# Patient Record
Sex: Female | Born: 1985 | Race: White | Hispanic: No | Marital: Married | Smoking: Never smoker
Health system: Southern US, Community
[De-identification: ages and names within clinical notes are randomized; demographics above are authoritative.]

## PROBLEM LIST (undated history)

## (undated) ENCOUNTER — Inpatient Hospital Stay (HOSPITAL_COMMUNITY): Payer: Self-pay

## (undated) ENCOUNTER — Inpatient Hospital Stay: Payer: Self-pay

## (undated) DIAGNOSIS — R87629 Unspecified abnormal cytological findings in specimens from vagina: Secondary | ICD-10-CM

## (undated) DIAGNOSIS — R51 Headache: Secondary | ICD-10-CM

## (undated) DIAGNOSIS — N83209 Unspecified ovarian cyst, unspecified side: Secondary | ICD-10-CM

## (undated) HISTORY — DX: Headache: R51

## (undated) HISTORY — DX: Unspecified abnormal cytological findings in specimens from vagina: R87.629

## (undated) HISTORY — PX: WISDOM TOOTH EXTRACTION: SHX21

---

## 2005-03-10 ENCOUNTER — Ambulatory Visit: Payer: Self-pay | Admitting: Pediatrics

## 2005-09-24 ENCOUNTER — Emergency Department: Payer: Self-pay | Admitting: Emergency Medicine

## 2006-08-03 ENCOUNTER — Emergency Department: Payer: Self-pay | Admitting: Emergency Medicine

## 2009-10-04 ENCOUNTER — Emergency Department: Payer: Self-pay | Admitting: Emergency Medicine

## 2013-07-25 LAB — OB RESULTS CONSOLE RPR: RPR: NONREACTIVE

## 2013-07-25 LAB — OB RESULTS CONSOLE RUBELLA ANTIBODY, IGM: Rubella: UNDETERMINED

## 2013-07-25 LAB — OB RESULTS CONSOLE HIV ANTIBODY (ROUTINE TESTING): HIV: NONREACTIVE

## 2013-07-25 LAB — OB RESULTS CONSOLE ABO/RH: RH TYPE: POSITIVE

## 2013-07-25 LAB — OB RESULTS CONSOLE HEPATITIS B SURFACE ANTIGEN: Hepatitis B Surface Ag: NEGATIVE

## 2013-08-01 LAB — OB RESULTS CONSOLE GC/CHLAMYDIA
Chlamydia: NEGATIVE
Gonorrhea: NEGATIVE

## 2013-09-22 ENCOUNTER — Inpatient Hospital Stay (HOSPITAL_COMMUNITY)
Admission: AD | Admit: 2013-09-22 | Discharge: 2013-09-22 | Disposition: A | Discharge status: Home / Self Care | Payer: BC Managed Care – PPO | Source: Ambulatory Visit | Attending: Obstetrics & Gynecology | Admitting: Obstetrics & Gynecology

## 2013-09-22 ENCOUNTER — Inpatient Hospital Stay (HOSPITAL_COMMUNITY): Payer: BC Managed Care – PPO

## 2013-09-22 ENCOUNTER — Ambulatory Visit (HOSPITAL_COMMUNITY): Payer: BC Managed Care – PPO

## 2013-09-22 ENCOUNTER — Encounter (HOSPITAL_COMMUNITY): Payer: Self-pay

## 2013-09-22 DIAGNOSIS — O99891 Other specified diseases and conditions complicating pregnancy: Secondary | ICD-10-CM | POA: Insufficient documentation

## 2013-09-22 DIAGNOSIS — R109 Unspecified abdominal pain: Secondary | ICD-10-CM | POA: Insufficient documentation

## 2013-09-22 DIAGNOSIS — N949 Unspecified condition associated with female genital organs and menstrual cycle: Secondary | ICD-10-CM | POA: Insufficient documentation

## 2013-09-22 DIAGNOSIS — O9989 Other specified diseases and conditions complicating pregnancy, childbirth and the puerperium: Secondary | ICD-10-CM

## 2013-09-22 LAB — WET PREP, GENITAL
Clue Cells Wet Prep HPF POC: NONE SEEN
Trich, Wet Prep: NONE SEEN

## 2013-09-22 LAB — URINALYSIS, ROUTINE W REFLEX MICROSCOPIC
Bilirubin Urine: NEGATIVE
Glucose, UA: NEGATIVE mg/dL
Hgb urine dipstick: NEGATIVE
Nitrite: NEGATIVE
Protein, ur: NEGATIVE mg/dL
Specific Gravity, Urine: 1.01 (ref 1.005–1.030)
pH: 7 (ref 5.0–8.0)

## 2013-09-22 LAB — URINE MICROSCOPIC-ADD ON

## 2013-09-22 NOTE — MAU Provider Note (Signed)
History     CSN: [id number]  Arrival date and time: 09/22/13 1239   First Provider Initiated Contact with Patient 09/22/13 1321      Chief Complaint  Patient presents with  . Abdominal Cramping  . Vaginal Pain   HPI  Julia Chandler is a 27 y.o. G2P0010 at [redacted]w[redacted]d who presents today with cramping. She states the she first noticed the pain when she woke up, but it was not so bad that it woke her up. Initially the pain was constant, but now it seems to come and go. She states that the pain is slightly better now. She rates the pain 7-8/10. She denies any bleeding, LOF or vaginal discharge. She states that she has been feeling the baby move today. She has not had intercourse in the last 24-48 hours.   History reviewed. No pertinent past medical history.  History reviewed. No pertinent past surgical history.  History reviewed. No pertinent family history.  History  Substance Use Topics  . Smoking status: Never Smoker   . Smokeless tobacco: Not on file  . Alcohol Use: No    Allergies: Not on File  No prescriptions prior to admission    ROS Physical Exam   Blood pressure 117/68, pulse 76, temperature 97.8 F (36.6 C), temperature source Oral, resp. rate 18, height 5\' 5"  (1.651 m), weight 67.586 kg (149 lb).  Physical Exam  Nursing note and vitals reviewed. Constitutional: She is oriented to person, place, and time. She appears well-developed and well-nourished. No distress.  Cardiovascular: Normal rate.   Respiratory: Effort normal.  GI: Soft. There is no tenderness.  Genitourinary:   External: no lesion Vagina: small amount of white discharge Cervix: pink, smooth, FT at ext os/closed at internal os  Uterus: AGA    Neurological: She is oriented to person, place, and time.  Skin: Skin is warm and dry.  Psychiatric: She has a normal mood and affect.    MAU Course  Procedures  1350: Spoke with Dr. Langston Masker, will send for ultrasound for cervical length.   Results  for orders placed during the hospital encounter of 09/22/13 (from the past 24 hour(s))  URINALYSIS, ROUTINE W REFLEX MICROSCOPIC     Status: Abnormal   Collection Time    09/22/13  1:04 PM      Result Value Range   Color, Urine YELLOW  YELLOW   APPearance CLEAR  CLEAR   Specific Gravity, Urine 1.010  1.005 - 1.030   pH 7.0  5.0 - 8.0   Glucose, UA NEGATIVE  NEGATIVE mg/dL   Hgb urine dipstick NEGATIVE  NEGATIVE   Bilirubin Urine NEGATIVE  NEGATIVE   Ketones, ur NEGATIVE  NEGATIVE mg/dL   Protein, ur NEGATIVE  NEGATIVE mg/dL   Urobilinogen, UA 0.2  0.0 - 1.0 mg/dL   Nitrite NEGATIVE  NEGATIVE   Leukocytes, UA SMALL (*) NEGATIVE  URINE MICROSCOPIC-ADD ON     Status: Abnormal   Collection Time    09/22/13  1:04 PM      Result Value Range   Squamous Epithelial / LPF RARE  RARE   WBC, UA 0-2  <3 WBC/hpf   Bacteria, UA FEW (*) RARE   Urine-Other MUCOUS PRESENT    WET PREP, GENITAL     Status: Abnormal   Collection Time    09/22/13  1:44 PM      Result Value Range   Yeast Wet Prep HPF POC NONE SEEN  NONE SEEN   Trich, Wet Prep NONE  SEEN  NONE SEEN   Clue Cells Wet Prep HPF POC NONE SEEN  NONE SEEN   WBC, Wet Prep HPF POC FEW (*) NONE SEEN   Cervical length by [place]: 3.4 cm  1654: D/W Dr. Langston Masker, ok for dc home now.   Assessment and Plan   1. Pelvic pain complicating pregnancy, antepartum, second trimester    Follow-up Information   Follow up with MORRIS, MEGAN, DO. (as scheduled )    Specialty:  Obstetrics and Gynecology   Contact information:   [address], [address], [address] [place] [address] [phone]        Medication List         prenatal multivitamin Tabs tablet  Take 1 tablet by mouth daily at 12 noon.       Return to MAU as needed   [name] 09/22/2013, 1:25 PM

## 2013-09-22 NOTE — MAU Note (Signed)
Pt states here for lower pelvic pain that began around 11:15, denies bleeding or vaginal discharge changes. Called MD office and was told to come to MAU for evaluation. Has dull constant pain with intermittent sharp pain that radiates into vagina. Denies uti s/s.

## 2013-10-25 NOTE — L&D Delivery Note (Signed)
Pt pushing x 2 hours and requests VE LOA/+3 R&B discussed and informed consent obtained.Delivery Note  VE applied and 2nd contraction delivered viable female Apgars 8,9 over 2nd degree laceration.  Placenta delivered spontaneously intact with 3VC. Repair with 2-0 Chromic with good support and hemostasis noted and R/V exam confirms.  PH art was sent.  [place] cord blood was not done.  Mother and baby were doing well.  EBL 300cc  Julia Campavid [name], MD

## 2013-12-28 ENCOUNTER — Inpatient Hospital Stay (HOSPITAL_COMMUNITY)
Admission: AD | Admit: 2013-12-28 | Discharge: 2013-12-30 | DRG: 778 | Disposition: A | Discharge status: Home / Self Care | Payer: BC Managed Care – PPO | Source: Ambulatory Visit | Attending: Obstetrics and Gynecology | Admitting: Obstetrics and Gynecology

## 2013-12-28 ENCOUNTER — Encounter (HOSPITAL_COMMUNITY): Payer: Self-pay | Admitting: *Deleted

## 2013-12-28 DIAGNOSIS — O4700 False labor before 37 completed weeks of gestation, unspecified trimester: Principal | ICD-10-CM | POA: Diagnosis present

## 2013-12-28 LAB — CBC
HCT: 36.1 % (ref 36.0–46.0)
Hemoglobin: 12.4 g/dL (ref 12.0–15.0)
MCH: 30.6 pg (ref 26.0–34.0)
MCHC: 34.3 g/dL (ref 30.0–36.0)
MCV: 89.1 fL (ref 78.0–100.0)
Platelets: 299 10*3/uL (ref 150–400)
RBC: 4.05 MIL/uL (ref 3.87–5.11)
RDW: 12.9 % (ref 11.5–15.5)
WBC: 11.7 10*3/uL — ABNORMAL HIGH (ref 4.0–10.5)

## 2013-12-28 LAB — OB RESULTS CONSOLE GBS: STREP GROUP B AG: NEGATIVE

## 2013-12-28 LAB — GROUP B STREP BY PCR: GROUP B STREP BY PCR: NEGATIVE

## 2013-12-28 LAB — OB RESULTS CONSOLE ANTIBODY SCREEN: Antibody Screen: NEGATIVE

## 2013-12-28 MED ORDER — CALCIUM CARBONATE ANTACID 500 MG PO CHEW: 2.0000 | CHEWABLE_TABLET | ORAL | Status: DC | PRN

## 2013-12-28 MED ORDER — LACTATED RINGERS IV SOLN
INTRAVENOUS | Status: DC
  Administered 2013-12-28 – 2013-12-30 (×4): via INTRAVENOUS

## 2013-12-28 MED ORDER — MAGNESIUM SULFATE 40 G IN LACTATED RINGERS - SIMPLE
2.0000 g/h | INTRAVENOUS | Status: DC
  Administered 2013-12-28: 4 g/h via INTRAVENOUS
  Administered 2013-12-29: 2 g/h via INTRAVENOUS
  Filled 2013-12-28 (×3): qty 500

## 2013-12-28 MED ORDER — DOCUSATE SODIUM 100 MG PO CAPS
100.0000 mg | ORAL_CAPSULE | Freq: Every day | ORAL | Status: DC
  Administered 2013-12-28 – 2013-12-30 (×3): 100 mg via ORAL
  Filled 2013-12-28 (×3): qty 1

## 2013-12-28 MED ORDER — ZOLPIDEM TARTRATE 5 MG PO TABS
5.0000 mg | ORAL_TABLET | Freq: Every evening | ORAL | Status: DC | PRN
  Administered 2013-12-28 – 2013-12-29 (×2): 5 mg via ORAL
  Filled 2013-12-28 (×2): qty 1

## 2013-12-28 MED ORDER — ACETAMINOPHEN 325 MG PO TABS
650.0000 mg | ORAL_TABLET | ORAL | Status: DC | PRN
  Administered 2013-12-28 – 2013-12-30 (×5): 650 mg via ORAL
  Filled 2013-12-28 (×5): qty 2

## 2013-12-28 MED ORDER — MAGNESIUM SULFATE BOLUS VIA INFUSION
2.0000 g | Freq: Once | INTRAVENOUS | Status: DC
  Filled 2013-12-28 (×3): qty 500

## 2013-12-28 MED ORDER — BETAMETHASONE SOD PHOS & ACET 6 (3-3) MG/ML IJ SUSP
12.0000 mg | INTRAMUSCULAR | Status: AC
  Administered 2013-12-28 – 2013-12-29 (×2): 12 mg via INTRAMUSCULAR
  Filled 2013-12-28 (×2): qty 2

## 2013-12-28 MED ORDER — PRENATAL MULTIVITAMIN CH
1.0000 | ORAL_TABLET | Freq: Every day | ORAL | Status: DC
  Administered 2013-12-28 – 2013-12-30 (×3): 1 via ORAL
  Filled 2013-12-28 (×3): qty 1

## 2013-12-28 MED ORDER — PRENATAL MULTIVITAMIN CH: 1.0000 | ORAL_TABLET | Freq: Every day | ORAL | Status: DC

## 2013-12-28 NOTE — Progress Notes (Signed)
IV start not charted.  Per label on IV was started @ 1330.

## 2013-12-28 NOTE — H&P (Signed)
Julia Chandler is a 28 y.o. female presenting for evaluation of PTL.  Seen in the office today complaining of ctxs off and on for the last 2 days, not improving with rest.  She stayed home and rested yesterday because of this but found not much improvement.  In the office was found to be 2/50/-2 and sent for further evaulation. History OB History   Grav Para Term Preterm Abortions TAB SAB Ect Mult Living   2    1  1         No past medical history on file. Past Surgical History  Procedure Laterality Date  . Wisdom tooth extraction     Family History: family history includes Cancer in her mother; Hypertension in her father; Miscarriages / [place] in her mother. Social History:  reports that she has never smoked. She has never used smokeless tobacco. She reports that she does not drink alcohol or use illicit drugs.   Prenatal Transfer Tool  Maternal Diabetes: No Genetic Screening: Normal Maternal Ultrasounds/Referrals: Normal Fetal Ultrasounds or other Referrals:  None Maternal Substance Abuse:  No Significant Maternal Medications:  None Significant Maternal Lab Results:  None Other Comments:  None  ROS    Blood pressure 107/60, pulse 94, temperature 97.8 F (36.6 C), resp. rate 18, height 5\' 6"  (1.676 m), weight 74.39 kg (164 lb). Exam Physical Exam   Cx 2/50/-2 Prenatal labs: ABO, Rh: A/Positive/-- (10/01 0000) Antibody:   Rubella: Equivocal (10/01 0000) RPR: Nonreactive (10/01 0000)  HBsAg: Negative (10/01 0000)  HIV: Non-reactive (10/01 0000)  GBS: Negative (03/06 0000)   Assessment/Plan: IUP at 33 3/7 PTL with cx change Plan Magnesium tocolysis and neuroprotection. BMZ series Check GBS   Julia Chandler C 12/28/2013, 5:02 PM

## 2013-12-28 NOTE — Plan of Care (Signed)
Problem: Consults Goal: Birthing Suites Patient Information Press F2 to bring up selections list   Pt < [redacted] weeks EGA     

## 2013-12-28 NOTE — Consult Note (Signed)
Neonatology Consult to Antenatal Patient:  I was asked by Dr. Rana SnareLowe to see this patient in order to provide antenatal counseling due to threatened preterm labor at 33 3/7 weeks.  Ms. Julia Chandler is admitted today at 3233 3/[redacted] weeks GA. She is currently having occasional contractions and is 2 cm dilated. She is getting BMZ and Magnesium Sulfate prophylaxis. This is her first baby.  I spoke with the patient and her mother-in-law. We discussed the worst case of delivery in the next 1-2 days, including usual DR management, possible respiratory complications and need for support, IV access, feedings (mother desires breast feeding, which was encouraged), LOS, Mortality and Morbidity, and long term outcomes. She had only 1-2 questions at this time, which I answered. I offered a NICU tour to any interested family members and would be glad to come back if she has more questions later.  Thank you for asking me to see this patient.  Doretha Souhristie C. Vicie Cech, MD Neonatologist  The total length of face-to-face or floor/unit time for this encounter was 20 minutes. Counseling and/or coordination of care was 15 minutes of the above.

## 2013-12-28 NOTE — Plan of Care (Signed)
Problem: Consults Goal: Birthing Suites Patient Information Press F2 to bring up selections list  Outcome: Completed/Met Date Met:  12/28/13  Pt < [redacted] weeks EGA

## 2013-12-29 NOTE — Progress Notes (Signed)
Julia Chandler is a 28 y.o. G2P0010 at 8760w4d by LMP admitted for Preterm labor  Subjective:   Objective: BP 119/71  Pulse 85  Temp(Src) 97.7 F (36.5 C) (Oral)  Resp 18  Ht 5\' 6"  (1.676 m)  Wt 74.39 kg (164 lb)  BMI 26.48 kg/m2 I/O last 3 completed shifts: In: 4211.7 [P.O.:1990; I.V.:2221.7] Out: 3725 [Urine:3725] Total I/O In: 450 [P.O.:200; I.V.:250] Out: 300 [Urine:300]  FHT:  FHR: 140 bpm, variability: moderate,  accelerations:  Present,  decelerations:  Absent UC:   irrtiability and last hour 2-3 mild ctxs SVE:      Labs: Lab Results  Component Value Date   WBC 11.7* 12/28/2013   HGB 12.4 12/28/2013   HCT 36.1 12/28/2013   MCV 89.1 12/28/2013   PLT 299 12/28/2013    Assessment / Plan: PTL at 33 4/7 Stable on Mag Continue x 24 hours then to procardia Complete BMZ series GBS -  Labor:   Preeclampsia:   Fetal Wellbeing:   Pain Control:   I/D:   Anticipated MO  Julia Chandler C 12/29/2013, 9:51 AM

## 2013-12-30 MED ORDER — NIFEDIPINE 10 MG PO CAPS: 10.0000 mg | ORAL_CAPSULE | Freq: Three times a day (TID) | ORAL | Status: DC

## 2013-12-30 MED ORDER — NIFEDIPINE 10 MG PO CAPS
10.0000 mg | ORAL_CAPSULE | Freq: Three times a day (TID) | ORAL | Status: DC
  Administered 2013-12-30: 10 mg via ORAL
  Filled 2013-12-30: qty 1

## 2013-12-30 NOTE — Discharge Summary (Signed)
Physician Discharge Summary  Patient ID: Julia Chandler MRN: [id number] DOB/AGE: 28-04-1986 28 y.o.  Admit date: 12/28/2013 Discharge date: 12/30/2013  Admission Diagnoses:Preterm labor   Discharge Diagnoses: Same Active Problems:   * No active hospital problems. *   Discharged Condition: good  Hospital Course: pt was admitted at 4633 3/7 weeks with PTL and cervical change.  She responded well to magnesium sulfate tocolysis and received her betamethasone series.  Was weaned to Procardia 10mg  TID and discharged to home with PTL warnings and increased rest  Consults: NICU  Significant Diagnostic Studies:   Treatments: steroids: betamethasone, and magnesium  Discharge Exam: Blood pressure 91/44, pulse 82, temperature 98.2 F (36.8 C), temperature source Oral, resp. rate 18, height 5\' 6"  (1.676 m), weight 74.39 kg (164 lb). General appearance: alert, cooperative, appears stated age and no distress Abd Gravid and nt  Disposition: 01-Home or Self Care  Discharge Orders   Future Orders Complete By Expires   Diet general  As directed    Discharge instructions  As directed    Comments:     Preterm labor warnings Increased Rest   Increase activity slowly  As directed    Sexual Activity Restrictions  As directed    Comments:     Nothing in the vagina until [redacted]weeks EGA       Medication List         acetaminophen 325 MG tablet  Commonly known as:  TYLENOL  Take 650 mg by mouth every 6 (six) hours as needed for moderate pain.     NIFEdipine 10 MG capsule  Commonly known as:  PROCARDIA  Take 1 capsule (10 mg total) by mouth 3 (three) times daily.     prenatal multivitamin Tabs tablet  Take 1 tablet by mouth daily at 12 noon.         Signed: Amous Crewe C 12/30/2013, 10:45 AM

## 2013-12-30 NOTE — Progress Notes (Signed)
Discharge teaching completed and reviewed with patient. Patient provided teachback and asked appropriate questions. Prescription called to [facility] in [place] per patient's request. Pt will follow up with care provider as requested. Discharge per wheelchair with family at side

## 2013-12-30 NOTE — Progress Notes (Signed)
Time change

## 2014-01-02 ENCOUNTER — Encounter (HOSPITAL_COMMUNITY): Payer: Self-pay

## 2014-01-02 ENCOUNTER — Inpatient Hospital Stay (HOSPITAL_COMMUNITY)
Admission: AD | Admit: 2014-01-02 | Discharge: 2014-01-02 | Disposition: A | Discharge status: Home / Self Care | Payer: BC Managed Care – PPO | Source: Ambulatory Visit | Attending: Obstetrics and Gynecology | Admitting: Obstetrics and Gynecology

## 2014-01-02 DIAGNOSIS — O9989 Other specified diseases and conditions complicating pregnancy, childbirth and the puerperium: Principal | ICD-10-CM

## 2014-01-02 DIAGNOSIS — O99891 Other specified diseases and conditions complicating pregnancy: Secondary | ICD-10-CM | POA: Insufficient documentation

## 2014-01-02 DIAGNOSIS — N898 Other specified noninflammatory disorders of vagina: Secondary | ICD-10-CM | POA: Insufficient documentation

## 2014-01-02 DIAGNOSIS — O26899 Other specified pregnancy related conditions, unspecified trimester: Secondary | ICD-10-CM

## 2014-01-02 LAB — URINALYSIS, ROUTINE W REFLEX MICROSCOPIC
BILIRUBIN URINE: NEGATIVE
Glucose, UA: NEGATIVE mg/dL
Hgb urine dipstick: NEGATIVE
KETONES UR: NEGATIVE mg/dL
Leukocytes, UA: NEGATIVE
NITRITE: NEGATIVE
PROTEIN: NEGATIVE mg/dL
Specific Gravity, Urine: 1.01 (ref 1.005–1.030)
UROBILINOGEN UA: 0.2 mg/dL (ref 0.0–1.0)
pH: 7 (ref 5.0–8.0)

## 2014-01-02 NOTE — MAU Note (Addendum)
Pt not sure if she is leaking, since 2315.  Clear.  No bleeding.  No contractions tonight. Baby moving well per pt.  Was recently admitted for contractions and is on Procardia now, was 2/50% on this past Friday.

## 2014-01-02 NOTE — MAU Provider Note (Signed)
History     CSN: [id number]  Arrival date and time: 01/02/14 [id number]   First Provider Initiated Contact with Patient 01/02/14 0049      Chief Complaint  Patient presents with  . Rupture of Membranes   HPI Ms. Alphonzo LemmingsWhitney Joanne GavelSutton is a 28 y.o. G2P0010 at 6816w1d who presents to MAU today with complaint of possible ROM about 1 1/2 hours ago. The patient states that she went to the bathroom and felt that she continued to have fluid coming out after she had thought she was finished urinating. She denies vaginal bleeding, contractions, abdominal pain, UTI symptoms or fever. She reports good fetal movement. She was recently inpatient for preterm labor and started on Procardia. She states last cervical exam was 2cm and 50%.   OB History   Grav Para Term Preterm Abortions TAB SAB Ect Mult Living   2    1  1          Past Medical History  Diagnosis Date  . Medical history non-contributory     Past Surgical History  Procedure Laterality Date  . Wisdom tooth extraction      Family History  Problem Relation Age of Onset  . Cancer Mother   . Miscarriages / [place] Mother   . Hypertension Father     History  Substance Use Topics  . Smoking status: Never Smoker   . Smokeless tobacco: Never Used  . Alcohol Use: No    Allergies: No Known Allergies  Prescriptions prior to admission  Medication Sig Dispense Refill  . acetaminophen (TYLENOL) 325 MG tablet Take 650 mg by mouth every 6 (six) hours as needed for moderate pain.      Marland Kitchen. docusate sodium (COLACE) 100 MG capsule Take 100 mg by mouth daily.      Marland Kitchen. NIFEdipine (PROCARDIA) 10 MG capsule Take 1 capsule (10 mg total) by mouth 3 (three) times daily.  90 capsule  0  . Prenatal Vit-Fe Fumarate-FA (PRENATAL MULTIVITAMIN) TABS tablet Take 1 tablet by mouth daily at 12 noon.        Review of Systems  Gastrointestinal: Negative for abdominal pain.  Genitourinary: Negative for dysuria, urgency and frequency.       + vaginal discharge ?  LOF Neg - vaginal bleeding   Physical Exam   Blood pressure 97/72, pulse 95, temperature 98.3 F (36.8 C), temperature source Oral, resp. rate 18, height 5\' 5"  (1.651 m), weight 167 lb 3.2 oz (75.841 kg).  Physical Exam  Constitutional: She is oriented to person, place, and time. She appears well-developed and well-nourished. No distress.  HENT:  Head: Normocephalic and atraumatic.  Cardiovascular: Normal rate.   Respiratory: Effort normal.  GI: Soft.  Genitourinary: No bleeding around the vagina. Vaginal discharge (small amount of mucus discharge noted) found.  No pooling noted  Neurological: She is alert and oriented to person, place, and time.  Skin: Skin is warm and dry. No erythema.  Psychiatric: She has a normal mood and affect.   Fern - negative   Fetal monitoring: Baseline: 140 bpm, moderate variability, + accelerations, no decelerations Contractions: none  MAU Course  Procedures None  MDM Discussed with Dr. Arelia SneddonMcComb. Ok for discharge  Assessment and Plan  A: Membranes intact Vaginal discharge in pregnancy  P: Discharge home Labor precautions and kick counts discussed Patient advised to continue Procardia and follow-up in the office as scheduled for routine prenatal care Patient may return to MAU as needed or if her condition were to change or  worsen  Freddi Starr, PA-C  01/02/2014, 12:49 AM

## 2014-01-02 NOTE — Discharge Instructions (Signed)
Preterm Labor Information Preterm labor is when labor starts at less than 37 weeks of pregnancy. The normal length of a pregnancy is 39 to 41 weeks. CAUSES Often, there is no identifiable underlying cause as to why a woman goes into preterm labor. One of the most common known causes of preterm labor is infection. Infections of the uterus, cervix, vagina, amniotic sac, bladder, kidney, or even the lungs (pneumonia) can cause labor to start. Other suspected causes of preterm labor include:   Urogenital infections, such as yeast infections and bacterial vaginosis.   Uterine abnormalities (uterine shape, uterine septum, fibroids, or bleeding from the placenta).   A cervix that has been operated on (it may fail to stay closed).   Malformations in the fetus.   Multiple gestations (twins, triplets, and so on).   Breakage of the amniotic sac.  RISK FACTORS  Having a previous history of preterm labor.   Having premature rupture of membranes (PROM).   Having a placenta that covers the opening of the cervix (placenta previa).   Having a placenta that separates from the uterus (placental abruption).   Having a cervix that is too weak to hold the fetus in the uterus (incompetent cervix).   Having too much fluid in the amniotic sac (polyhydramnios).   Taking illegal drugs or smoking while pregnant.   Not gaining enough weight while pregnant.   Being younger than 18 and older than 28 years old.   Having a low socioeconomic status.   Being African American. SYMPTOMS Signs and symptoms of preterm labor include:   Menstrual-like cramps, abdominal pain, or back pain.  Uterine contractions that are regular, as frequent as six in an hour, regardless of their intensity (may be mild or painful).  Contractions that start on the top of the uterus and spread down to the lower abdomen and back.   A sense of increased pelvic pressure.   A watery or bloody mucus discharge that  comes from the vagina.  TREATMENT Depending on the length of the pregnancy and other circumstances, your health care provider may suggest bed rest. If necessary, there are medicines that can be given to stop contractions and to mature the fetal lungs. If labor happens before 34 weeks of pregnancy, a prolonged hospital stay may be recommended. Treatment depends on the condition of both you and the fetus.  WHAT SHOULD YOU DO IF YOU THINK YOU ARE IN PRETERM LABOR? Call your health care provider right away. You will need to go to the hospital to get checked immediately. HOW CAN YOU PREVENT PRETERM LABOR IN FUTURE PREGNANCIES? You should:   Stop smoking if you smoke.  Maintain healthy weight gain and avoid chemicals and drugs that are not necessary.  Be watchful for any type of infection.  Inform your health care provider if you have a known history of preterm labor. Document Released: 01/01/2004 Document Revised: 06/13/2013 Document Reviewed: 11/13/2012 [employer] Patient Information 2014 [employer].    

## 2014-02-01 ENCOUNTER — Encounter (HOSPITAL_COMMUNITY): Payer: Self-pay | Admitting: *Deleted

## 2014-02-01 ENCOUNTER — Telehealth (HOSPITAL_COMMUNITY): Payer: Self-pay | Admitting: *Deleted

## 2014-02-01 NOTE — Telephone Encounter (Signed)
Preadmission screen  

## 2014-02-04 ENCOUNTER — Encounter (HOSPITAL_COMMUNITY): Payer: Self-pay | Admitting: *Deleted

## 2014-02-04 ENCOUNTER — Inpatient Hospital Stay (HOSPITAL_COMMUNITY)
Admission: AD | Admit: 2014-02-04 | Discharge: 2014-02-04 | Disposition: A | Discharge status: Home / Self Care | Payer: BC Managed Care – PPO | Source: Ambulatory Visit | Attending: Obstetrics and Gynecology | Admitting: Obstetrics and Gynecology

## 2014-02-04 DIAGNOSIS — O479 False labor, unspecified: Secondary | ICD-10-CM | POA: Insufficient documentation

## 2014-02-04 DIAGNOSIS — R109 Unspecified abdominal pain: Secondary | ICD-10-CM | POA: Insufficient documentation

## 2014-02-04 HISTORY — DX: Unspecified ovarian cyst, unspecified side: N83.209

## 2014-02-04 LAB — AMNISURE RUPTURE OF MEMBRANE (ROM) NOT AT ARMC: Amnisure ROM: NEGATIVE

## 2014-02-04 LAB — POCT FERN TEST: POCT Fern Test: NEGATIVE

## 2014-02-04 NOTE — MAU Note (Signed)
Panties have been wet all day, has been having back contractions, increased cramping.

## 2014-02-04 NOTE — Discharge Instructions (Signed)
Third Trimester of Pregnancy  The third trimester is from week 29 through week 42, months 7 through 9. The third trimester is a time when the fetus is growing rapidly. At the end of the ninth month, the fetus is about 20 inches in length and weighs 6 10 pounds.   BODY CHANGES  Your body goes through many changes during pregnancy. The changes vary from woman to woman.    Your weight will continue to increase. You can expect to gain 25 35 pounds (11 16 kg) by the end of the pregnancy.   You may begin to get stretch marks on your hips, abdomen, and breasts.   You may urinate more often because the fetus is moving lower into your pelvis and pressing on your bladder.   You may develop or continue to have heartburn as a result of your pregnancy.   You may develop constipation because certain hormones are causing the muscles that push waste through your intestines to slow down.   You may develop hemorrhoids or swollen, bulging veins (varicose veins).   You may have pelvic pain because of the weight gain and pregnancy hormones relaxing your joints between the bones in your pelvis. Back aches may result from over exertion of the muscles supporting your posture.   Your breasts will continue to grow and be tender. A yellow discharge may leak from your breasts called colostrum.   Your belly button may stick out.   You may feel short of breath because of your expanding uterus.   You may notice the fetus "dropping," or moving lower in your abdomen.   You may have a bloody mucus discharge. This usually occurs a few days to a week before labor begins.   Your cervix becomes thin and soft (effaced) near your due date.  WHAT TO EXPECT AT YOUR PRENATAL EXAMS   You will have prenatal exams every 2 weeks until week 36. Then, you will have weekly prenatal exams. During a routine prenatal visit:   You will be weighed to make sure you and the fetus are growing normally.   Your blood pressure is taken.   Your abdomen will be  measured to track your baby's growth.   The fetal heartbeat will be listened to.   Any test results from the previous visit will be discussed.   You may have a cervical check near your due date to see if you have effaced.  At around 36 weeks, your caregiver will check your cervix. At the same time, your caregiver will also perform a test on the secretions of the vaginal tissue. This test is to determine if a type of bacteria, Group B streptococcus, is present. Your caregiver will explain this further.  Your caregiver may ask you:   What your birth plan is.   How you are feeling.   If you are feeling the baby move.   If you have had any abnormal symptoms, such as leaking fluid, bleeding, severe headaches, or abdominal cramping.   If you have any questions.  Other tests or screenings that may be performed during your third trimester include:   Blood tests that check for low iron levels (anemia).   Fetal testing to check the health, activity level, and growth of the fetus. Testing is done if you have certain medical conditions or if there are problems during the pregnancy.  FALSE LABOR  You may feel small, irregular contractions that eventually go away. These are called Braxton Hicks contractions, or   false labor. Contractions may last for hours, days, or even weeks before true labor sets in. If contractions come at regular intervals, intensify, or become painful, it is best to be seen by your caregiver.   SIGNS OF LABOR    Menstrual-like cramps.   Contractions that are 5 minutes apart or less.   Contractions that start on the top of the uterus and spread down to the lower abdomen and back.   A sense of increased pelvic pressure or back pain.   A watery or bloody mucus discharge that comes from the vagina.  If you have any of these signs before the 37th week of pregnancy, call your caregiver right away. You need to go to the hospital to get checked immediately.  HOME CARE INSTRUCTIONS    Avoid all  smoking, herbs, alcohol, and unprescribed drugs. These chemicals affect the formation and growth of the baby.   Follow your caregiver's instructions regarding medicine use. There are medicines that are either safe or unsafe to take during pregnancy.   Exercise only as directed by your caregiver. Experiencing uterine cramps is a good sign to stop exercising.   Continue to eat regular, healthy meals.   Wear a good support bra for breast tenderness.   Do not use hot tubs, steam rooms, or saunas.   Wear your seat belt at all times when driving.   Avoid raw meat, uncooked cheese, cat litter boxes, and soil used by cats. These carry germs that can cause birth defects in the baby.   Take your prenatal vitamins.   Try taking a stool softener (if your caregiver approves) if you develop constipation. Eat more high-fiber foods, such as fresh vegetables or fruit and whole grains. Drink plenty of fluids to keep your urine clear or pale yellow.   Take warm sitz baths to soothe any pain or discomfort caused by hemorrhoids. Use hemorrhoid cream if your caregiver approves.   If you develop varicose veins, wear support hose. Elevate your feet for 15 minutes, 3 4 times a day. Limit salt in your diet.   Avoid heavy lifting, wear low heal shoes, and practice good posture.   Rest a lot with your legs elevated if you have leg cramps or low back pain.   Visit your dentist if you have not gone during your pregnancy. Use a soft toothbrush to brush your teeth and be gentle when you floss.   A sexual relationship may be continued unless your caregiver directs you otherwise.   Do not travel far distances unless it is absolutely necessary and only with the approval of your caregiver.   Take prenatal classes to understand, practice, and ask questions about the labor and delivery.   Make a trial run to the hospital.   Pack your hospital bag.   Prepare the baby's nursery.   Continue to go to all your prenatal visits as directed  by your caregiver.  SEEK MEDICAL CARE IF:   You are unsure if you are in labor or if your water has broken.   You have dizziness.   You have mild pelvic cramps, pelvic pressure, or nagging pain in your abdominal area.   You have persistent nausea, vomiting, or diarrhea.   You have a bad smelling vaginal discharge.   You have pain with urination.  SEEK IMMEDIATE MEDICAL CARE IF:    You have a fever.   You are leaking fluid from your vagina.   You have spotting or bleeding from your vagina.     You have severe abdominal cramping or pain.   You have rapid weight loss or gain.   You have shortness of breath with chest pain.   You notice sudden or extreme swelling of your face, hands, ankles, feet, or legs.   You have not felt your baby move in over an hour.   You have severe headaches that do not go away with medicine.   You have vision changes.  Document Released: 10/05/2001 Document Revised: 06/13/2013 Document Reviewed: 12/12/2012  [employer] Patient Information 2014 [employer].

## 2014-02-04 NOTE — MAU Note (Signed)
Pt agrees, no ctx's since arrival.  Clear/white water noted at introitus prior to exam.  Neg fern.  cx unchanged

## 2014-02-07 ENCOUNTER — Inpatient Hospital Stay (HOSPITAL_COMMUNITY)
Admission: AD | Admit: 2014-02-07 | Discharge: 2014-02-10 | DRG: 775 | Disposition: A | Discharge status: Home / Self Care | Payer: BC Managed Care – PPO | Source: Ambulatory Visit | Attending: Obstetrics and Gynecology | Admitting: Obstetrics and Gynecology

## 2014-02-07 DIAGNOSIS — IMO0001 Reserved for inherently not codable concepts without codable children: Secondary | ICD-10-CM

## 2014-02-07 DIAGNOSIS — O411090 Infection of amniotic sac and membranes, unspecified, unspecified trimester, not applicable or unspecified: Secondary | ICD-10-CM | POA: Diagnosis present

## 2014-02-08 ENCOUNTER — Encounter (HOSPITAL_COMMUNITY): Payer: BC Managed Care – PPO | Admitting: Anesthesiology

## 2014-02-08 ENCOUNTER — Encounter (HOSPITAL_COMMUNITY): Payer: Self-pay | Admitting: Anesthesiology

## 2014-02-08 ENCOUNTER — Inpatient Hospital Stay (HOSPITAL_COMMUNITY)
Admission: RE | Admit: 2014-02-08 | Discharge: 2014-02-08 | Disposition: A | Discharge status: Home / Self Care | Payer: BC Managed Care – PPO | Source: Ambulatory Visit | Attending: Obstetrics and Gynecology | Admitting: Obstetrics and Gynecology

## 2014-02-08 ENCOUNTER — Inpatient Hospital Stay (HOSPITAL_COMMUNITY): Payer: BC Managed Care – PPO | Admitting: Anesthesiology

## 2014-02-08 DIAGNOSIS — IMO0001 Reserved for inherently not codable concepts without codable children: Secondary | ICD-10-CM

## 2014-02-08 LAB — RPR

## 2014-02-08 LAB — CBC
HCT: 37 % (ref 36.0–46.0)
HCT: 35.6 % — ABNORMAL LOW (ref 36.0–46.0)
Hemoglobin: 12.9 g/dL (ref 12.0–15.0)
Hemoglobin: 12.3 g/dL (ref 12.0–15.0)
MCH: 31.3 pg (ref 26.0–34.0)
MCH: 31.2 pg (ref 26.0–34.0)
MCHC: 34.9 g/dL (ref 30.0–36.0)
MCHC: 34.6 g/dL (ref 30.0–36.0)
MCV: 89.8 fL (ref 78.0–100.0)
MCV: 90.4 fL (ref 78.0–100.0)
PLATELETS: 268 10*3/uL (ref 150–400)
Platelets: 286 10*3/uL (ref 150–400)
RBC: 4.12 MIL/uL (ref 3.87–5.11)
RBC: 3.94 MIL/uL (ref 3.87–5.11)
RDW: 13.9 % (ref 11.5–15.5)
RDW: 14 % (ref 11.5–15.5)
WBC: 12.1 10*3/uL — ABNORMAL HIGH (ref 4.0–10.5)
WBC: 14.8 10*3/uL — AB (ref 4.0–10.5)

## 2014-02-08 LAB — COMPREHENSIVE METABOLIC PANEL
ALBUMIN: 2.4 g/dL — AB (ref 3.5–5.2)
ALT: 8 U/L (ref 0–35)
AST: 14 U/L (ref 0–37)
Alkaline Phosphatase: 170 U/L — ABNORMAL HIGH (ref 39–117)
BUN: 6 mg/dL (ref 6–23)
CALCIUM: 8.7 mg/dL (ref 8.4–10.5)
CO2: 18 meq/L — AB (ref 19–32)
Chloride: 101 mEq/L (ref 96–112)
Creatinine, Ser: 0.61 mg/dL (ref 0.50–1.10)
GFR calc Af Amer: >90 mL/min (ref >90)
GFR calc non Af Amer: >90 mL/min (ref >90)
Glucose, Bld: 81 mg/dL (ref 70–99)
Potassium: 4.1 mEq/L (ref 3.7–5.3)
Sodium: 136 mEq/L — ABNORMAL LOW (ref 137–147)
Total Protein: 5.9 g/dL — ABNORMAL LOW (ref 6.0–8.3)

## 2014-02-08 LAB — URIC ACID: Uric Acid, Serum: 4.7 mg/dL (ref 2.4–7.0)

## 2014-02-08 MED ORDER — ONDANSETRON HCL 4 MG/2ML IJ SOLN: 4.0000 mg | INTRAMUSCULAR | Status: DC | PRN

## 2014-02-08 MED ORDER — TETANUS-DIPHTH-ACELL PERTUSSIS 5-2.5-18.5 LF-MCG/0.5 IM SUSP
0.5000 mL | Freq: Once | INTRAMUSCULAR | Status: DC
Start: 2014-02-09 — End: 2014-02-10

## 2014-02-08 MED ORDER — DIBUCAINE 1 % RE OINT
1.0000 "application " | TOPICAL_OINTMENT | RECTAL | Status: DC | PRN
  Administered 2014-02-08: 1 via RECTAL
  Filled 2014-02-08: qty 28

## 2014-02-08 MED ORDER — SENNOSIDES-DOCUSATE SODIUM 8.6-50 MG PO TABS
2.0000 | ORAL_TABLET | ORAL | Status: DC
  Administered 2014-02-08: 1 via ORAL
  Administered 2014-02-09: 2 via ORAL
  Filled 2014-02-08: qty 1
  Filled 2014-02-08: qty 2

## 2014-02-08 MED ORDER — EPHEDRINE 5 MG/ML INJ
10.0000 mg | INTRAVENOUS | Status: DC | PRN
  Filled 2014-02-08: qty 4

## 2014-02-08 MED ORDER — WITCH HAZEL-GLYCERIN EX PADS
1.0000 "application " | MEDICATED_PAD | CUTANEOUS | Status: DC | PRN
  Administered 2014-02-09: 1 via TOPICAL

## 2014-02-08 MED ORDER — SIMETHICONE 80 MG PO CHEW: 80.0000 mg | CHEWABLE_TABLET | ORAL | Status: DC | PRN

## 2014-02-08 MED ORDER — PHENYLEPHRINE 40 MCG/ML (10ML) SYRINGE FOR IV PUSH (FOR BLOOD PRESSURE SUPPORT)
80.0000 ug | PREFILLED_SYRINGE | INTRAVENOUS | Status: DC | PRN
  Filled 2014-02-08: qty 10

## 2014-02-08 MED ORDER — LACTATED RINGERS IV SOLN: 500.0000 mL | INTRAVENOUS | Status: DC | PRN

## 2014-02-08 MED ORDER — LIDOCAINE HCL (PF) 1 % IJ SOLN
30.0000 mL | INTRAMUSCULAR | Status: AC | PRN
  Administered 2014-02-08: 30 mL via SUBCUTANEOUS
  Filled 2014-02-08: qty 30

## 2014-02-08 MED ORDER — ONDANSETRON HCL 4 MG/2ML IJ SOLN
4.0000 mg | Freq: Four times a day (QID) | INTRAMUSCULAR | Status: DC | PRN
Start: 2014-02-08 — End: 2014-02-08

## 2014-02-08 MED ORDER — OXYTOCIN BOLUS FROM INFUSION: 500.0000 mL | INTRAVENOUS | Status: DC

## 2014-02-08 MED ORDER — PRENATAL MULTIVITAMIN CH
1.0000 | ORAL_TABLET | Freq: Every day | ORAL | Status: DC
  Administered 2014-02-09 – 2014-02-10 (×2): 1 via ORAL
  Filled 2014-02-08 (×2): qty 1

## 2014-02-08 MED ORDER — OXYCODONE-ACETAMINOPHEN 5-325 MG PO TABS: 1.0000 | ORAL_TABLET | ORAL | Status: DC | PRN

## 2014-02-08 MED ORDER — LACTATED RINGERS IV SOLN: 500.0000 mL | Freq: Once | INTRAVENOUS | Status: DC

## 2014-02-08 MED ORDER — DIPHENHYDRAMINE HCL 25 MG PO CAPS: 25.0000 mg | ORAL_CAPSULE | Freq: Four times a day (QID) | ORAL | Status: DC | PRN

## 2014-02-08 MED ORDER — DIPHENHYDRAMINE HCL 50 MG/ML IJ SOLN: 12.5000 mg | INTRAMUSCULAR | Status: DC | PRN

## 2014-02-08 MED ORDER — OXYTOCIN 40 UNITS IN LACTATED RINGERS INFUSION - SIMPLE MED
62.5000 mL/h | INTRAVENOUS | Status: DC
  Administered 2014-02-08: 999 mL/h via INTRAVENOUS
  Filled 2014-02-08: qty 1000

## 2014-02-08 MED ORDER — BUTORPHANOL TARTRATE 1 MG/ML IJ SOLN: 1.0000 mg | INTRAMUSCULAR | Status: DC | PRN

## 2014-02-08 MED ORDER — ACETAMINOPHEN 325 MG PO TABS: 650.0000 mg | ORAL_TABLET | ORAL | Status: DC | PRN

## 2014-02-08 MED ORDER — BENZOCAINE-MENTHOL 20-0.5 % EX AERO
1.0000 | INHALATION_SPRAY | CUTANEOUS | Status: DC | PRN
Start: 2014-02-08 — End: 2014-02-10
  Administered 2014-02-08: 1 via TOPICAL
  Filled 2014-02-08: qty 56

## 2014-02-08 MED ORDER — FENTANYL 2.5 MCG/ML BUPIVACAINE 1/10 % EPIDURAL INFUSION (WH - ANES)
14.0000 mL/h | INTRAMUSCULAR | Status: DC | PRN
  Administered 2014-02-08: 14 mL/h via EPIDURAL
  Filled 2014-02-08 (×2): qty 125

## 2014-02-08 MED ORDER — MEASLES, MUMPS & RUBELLA VAC ~~LOC~~ INJ: 0.5000 mL | INJECTION | Freq: Once | SUBCUTANEOUS | Status: DC

## 2014-02-08 MED ORDER — ONDANSETRON HCL 4 MG PO TABS: 4.0000 mg | ORAL_TABLET | ORAL | Status: DC | PRN

## 2014-02-08 MED ORDER — MEDROXYPROGESTERONE ACETATE 150 MG/ML IM SUSP: 150.0000 mg | INTRAMUSCULAR | Status: DC | PRN

## 2014-02-08 MED ORDER — ZOLPIDEM TARTRATE 5 MG PO TABS
5.0000 mg | ORAL_TABLET | Freq: Every evening | ORAL | Status: DC | PRN
  Administered 2014-02-08: 5 mg via ORAL
  Filled 2014-02-08: qty 1

## 2014-02-08 MED ORDER — LACTATED RINGERS IV SOLN
INTRAVENOUS | Status: DC
  Administered 2014-02-08 (×2): via INTRAVENOUS

## 2014-02-08 MED ORDER — TRAMADOL HCL 50 MG PO TABS
50.0000 mg | ORAL_TABLET | Freq: Four times a day (QID) | ORAL | Status: DC | PRN
  Administered 2014-02-08 – 2014-02-10 (×6): 50 mg via ORAL
  Filled 2014-02-08 (×6): qty 1

## 2014-02-08 MED ORDER — LIDOCAINE HCL (PF) 1 % IJ SOLN
INTRAMUSCULAR | Status: DC | PRN
  Administered 2014-02-08 (×2): 4 mL

## 2014-02-08 MED ORDER — IBUPROFEN 600 MG PO TABS
600.0000 mg | ORAL_TABLET | Freq: Four times a day (QID) | ORAL | Status: DC
  Administered 2014-02-08 – 2014-02-10 (×8): 600 mg via ORAL
  Filled 2014-02-08 (×8): qty 1

## 2014-02-08 MED ORDER — EPHEDRINE 5 MG/ML INJ: 10.0000 mg | INTRAVENOUS | Status: DC | PRN

## 2014-02-08 MED ORDER — PHENYLEPHRINE 40 MCG/ML (10ML) SYRINGE FOR IV PUSH (FOR BLOOD PRESSURE SUPPORT): 80.0000 ug | PREFILLED_SYRINGE | INTRAVENOUS | Status: DC | PRN

## 2014-02-08 MED ORDER — LANOLIN HYDROUS EX OINT
TOPICAL_OINTMENT | CUTANEOUS | Status: DC | PRN
Start: 2014-02-08 — End: 2014-02-10

## 2014-02-08 MED ORDER — ZOLPIDEM TARTRATE 5 MG PO TABS: 5.0000 mg | ORAL_TABLET | Freq: Every evening | ORAL | Status: DC | PRN

## 2014-02-08 MED ORDER — CITRIC ACID-SODIUM CITRATE 334-500 MG/5ML PO SOLN
30.0000 mL | ORAL | Status: DC | PRN
Start: 2014-02-08 — End: 2014-02-08

## 2014-02-08 MED ORDER — FENTANYL 2.5 MCG/ML BUPIVACAINE 1/10 % EPIDURAL INFUSION (WH - ANES)
INTRAMUSCULAR | Status: DC | PRN
  Administered 2014-02-08: 14 mL/h via EPIDURAL

## 2014-02-08 MED ORDER — IBUPROFEN 600 MG PO TABS: 600.0000 mg | ORAL_TABLET | Freq: Four times a day (QID) | ORAL | Status: DC | PRN

## 2014-02-08 NOTE — H&P (Signed)
Julia Chandler is a 28 y.o. female presenting for SOL. Denies HA, Blurry vision, epigastric pain. Pregnancy complicated by PTL with admission in March. Maternal Medical History:  Reason for admission: Contractions.   Fetal activity: Perceived fetal activity is normal.    Prenatal complications: Preterm labor.     OB History   Grav Para Term Preterm Abortions TAB SAB Ect Mult Living   2    1  1         Past Medical History  Diagnosis Date  . Headache(784.0)   . Preterm labor   . Ovarian cyst   . Vaginal Pap smear, abnormal     f/u ok   Past Surgical History  Procedure Laterality Date  . Wisdom tooth extraction     Family History: family history includes Anxiety disorder in her sister; Cancer in her maternal grandfather and mother; Heart disease in her paternal grandfather and paternal grandmother; Hypertension in her father, paternal grandfather, and paternal grandmother; Miscarriages / [place] in her mother; Thyroid disease in her maternal grandmother. Social History:  reports that she has never smoked. She has never used smokeless tobacco. She reports that she does not drink alcohol or use illicit drugs.   Prenatal Transfer Tool  Maternal Diabetes: No Genetic Screening: Normal Maternal Ultrasounds/Referrals: Normal Fetal Ultrasounds or other Referrals:  None Maternal Substance Abuse:  No Significant Maternal Medications:  None Significant Maternal Lab Results:  None Other Comments:  None  Review of Systems  Eyes: Negative for blurred vision.  Gastrointestinal: Negative for abdominal pain.  Neurological: Negative for headaches.    Dilation: 8.5 Effacement (%): 100 Station: 0 Exam by:: Lucas MallowKlashley, RN Blood pressure 128/85, pulse 86, temperature 98.1 F (36.7 C), temperature source Oral, resp. rate 18, height 5\' 6"  (1.676 m), weight 172 lb (78.019 kg), SpO2 99.00%. Maternal Exam:  Uterine Assessment: Contraction strength is firm.  Contraction frequency is regular.    Abdomen: Fetal presentation: vertex     Fetal Exam Fetal State Assessment: Category I - tracings are normal.     Physical Exam  Cardiovascular: Normal rate and regular rhythm.   Respiratory: Effort normal and breath sounds normal.  GI: Soft. There is no tenderness.  Neurological: She has normal reflexes.    Prenatal labs: ABO, Rh: A/Positive/-- (10/01 0000) Antibody: Negative (03/06 1821) Rubella: Equivocal (10/01 0000) RPR: Nonreactive (10/01 0000)  HBsAg: Negative (10/01 0000)  HIV: Non-reactive (10/01 0000)  GBS: Negative (03/06 0000)   Assessment/Plan: 28 yo G2P0 at 39 3/7 weeks in active labor Labile BPs-will check PIH labs   Julia LocusJames E Nolan Lasser Chandler 02/08/2014, 5:30 AM

## 2014-02-08 NOTE — Anesthesia Preprocedure Evaluation (Signed)
Anesthesia Evaluation  Patient identified by MRN, date of birth, ID band Patient awake    Reviewed: Allergy & Precautions, H&P , Patient's Chart, lab work & pertinent test results  Airway Mallampati: III TM Distance: >3 FB Neck ROM: Full    Dental no notable dental hx. (+) Teeth Intact   Pulmonary neg pulmonary ROS,  breath sounds clear to auscultation  Pulmonary exam normal       Cardiovascular negative cardio ROS  Rhythm:Regular Rate:Normal     Neuro/Psych  Headaches, negative psych ROS   GI/Hepatic negative GI ROS, Neg liver ROS,   Endo/Other  negative endocrine ROS  Renal/GU negative Renal ROS  negative genitourinary   Musculoskeletal negative musculoskeletal ROS (+)   Abdominal (+) - obese,   Peds  Hematology negative hematology ROS (+)   Anesthesia Other Findings   Reproductive/Obstetrics (+) Pregnancy Hx/o Preterm labor                           Anesthesia Physical Anesthesia Plan  ASA: II  Anesthesia Plan: Epidural   Post-op Pain Management:    Induction:   Airway Management Planned: Natural Airway  Additional Equipment:   Intra-op Plan:   Post-operative Plan:   Informed Consent: I have reviewed the patients History and Physical, chart, labs and discussed the procedure including the risks, benefits and alternatives for the proposed anesthesia with the patient or authorized representative who has indicated his/her understanding and acceptance.     Plan Discussed with: Anesthesiologist  Anesthesia Plan Comments:         Anesthesia Quick Evaluation

## 2014-02-08 NOTE — Anesthesia Procedure Notes (Signed)
Epidural Patient location during procedure: OB Start time: 02/08/2014 2:29 AM  Staffing Anesthesiologist: Caroline Matters A. Performed by: anesthesiologist   Preanesthetic Checklist Completed: patient identified, site marked, surgical consent, pre-op evaluation, timeout performed, IV checked, risks and benefits discussed and monitors and equipment checked  Epidural Patient position: sitting Prep: site prepped and draped and DuraPrep Patient monitoring: continuous pulse ox and blood pressure Approach: midline Location: L3-L4 Injection technique: LOR air  Needle:  Needle type: Tuohy  Needle gauge: 17 G Needle length: 9 cm and 9 Needle insertion depth: 5 cm cm Catheter type: closed end flexible Catheter size: 19 Gauge Catheter at skin depth: 10 cm Test dose: negative and Other  Assessment Events: blood not aspirated, injection not painful, no injection resistance, negative IV test and no paresthesia  Additional Notes Patient identified. Risks and benefits discussed including failed block, incomplete  Pain control, post dural puncture headache, nerve damage, paralysis, blood pressure Changes, nausea, vomiting, reactions to medications-both toxic and allergic and post Partum back pain. All questions were answered. Patient expressed understanding and wished to proceed. Sterile technique was used throughout procedure. Epidural site was Dressed with sterile barrier dressing. No paresthesias, signs of intravascular injection Or signs of intrathecal spread were encountered.  Patient was more comfortable after the epidural was dosed. Please see RN's note for documentation of vital signs and FHR which are stable.

## 2014-02-09 LAB — CBC
HCT: 30.7 % — ABNORMAL LOW (ref 36.0–46.0)
HEMOGLOBIN: 10.3 g/dL — AB (ref 12.0–15.0)
MCH: 30.3 pg (ref 26.0–34.0)
MCHC: 33.2 g/dL (ref 30.0–36.0)
MCV: 91.1 fL (ref 78.0–100.0)
Platelets: 240 10*3/uL (ref 150–400)
RBC: 3.37 MIL/uL — ABNORMAL LOW (ref 3.87–5.11)
RDW: 14.3 % (ref 11.5–15.5)
WBC: 15 10*3/uL — ABNORMAL HIGH (ref 4.0–10.5)

## 2014-02-09 NOTE — Anesthesia Postprocedure Evaluation (Signed)
Anesthesia Post Note  Patient: Julia Chandler  Procedure(s) Performed: * No procedures listed *  Anesthesia type: Epidural  Patient location: Mother/Baby  Post pain: Pain level controlled  Post assessment: Post-op Vital signs reviewed  Last Vitals:  Filed Vitals:   02/09/14 0510  BP: 99/66  Pulse: 78  Temp: 36.4 C  Resp: 18    Post vital signs: Reviewed  Level of consciousness:alert  Complications: No apparent anesthesia complications

## 2014-02-09 NOTE — Progress Notes (Signed)
Post Partum Day 1 Subjective: no complaints, up ad lib, voiding and tolerating PO  Objective: Blood pressure 99/66, pulse 78, temperature 97.6 F (36.4 C), temperature source Oral, resp. rate 18, height 5\' 6"  (1.676 m), weight 78.019 kg (172 lb), SpO2 98.00%, unknown if currently breastfeeding.  Physical Exam:  General: alert, cooperative, appears stated age and no distress Lochia: appropriate Uterine Fundus: firm Incision: healing well DVT Evaluation: No evidence of DVT seen on physical exam.   Recent Labs  02/08/14 0635 02/09/14 0617  HGB 12.3 10.3*  HCT 35.6* 30.7*    Assessment/Plan: Plan for discharge tomorrow and Breastfeeding   LOS: 2 days   Julia DanielsDavid C Karis Chandler 02/09/2014, 10:50 AM

## 2014-02-10 MED ORDER — IBUPROFEN 600 MG PO TABS: 600.0000 mg | ORAL_TABLET | Freq: Four times a day (QID) | ORAL | Status: DC

## 2014-02-10 MED ORDER — TRAMADOL HCL 50 MG PO TABS: 50.0000 mg | ORAL_TABLET | Freq: Four times a day (QID) | ORAL | Status: DC | PRN

## 2014-02-10 NOTE — Plan of Care (Signed)
Problem: Discharge Progression Outcomes Goal: MMR given as ordered Outcome: Not Met (add Reason) Pt. decided that she will wait to take the MMR vaccine when she returns for her first postpartum visit at her obstetrician's office.

## 2014-02-10 NOTE — Discharge Summary (Signed)
Obstetric Discharge Summary Reason for Admission: onset of labor Prenatal Procedures: none Intrapartum Procedures: spontaneous vaginal delivery Postpartum Procedures: none Complications-Operative and Postpartum: 2 degree perineal laceration Hemoglobin  Date Value Ref Range Status  02/09/2014 10.3* 12.0 - 15.0 g/dL Final     REPEATED TO VERIFY     HCT  Date Value Ref Range Status  02/09/2014 30.7* 36.0 - 46.0 % Final    Physical Exam:  General: alert, cooperative, appears stated age and no distress Lochia: appropriate Uterine Fundus: firm Incision: healing well DVT Evaluation: No evidence of DVT seen on physical exam.  Discharge Diagnoses: Amnionitis  Discharge Information: Date: 02/10/2014 Activity: pelvic rest Diet: routine Medications: Ibuprofen and tramadol Condition: stable Instructions: refer to practice specific booklet Discharge to: home   Newborn Data: Live born female  Birth Weight: 6 lb 13.7 oz (3110 g) APGAR: 7, 9  Home with mother.  Julia Chandler 02/10/2014, 10:15 AM

## 2014-08-26 ENCOUNTER — Encounter (HOSPITAL_COMMUNITY): Payer: Self-pay | Admitting: Anesthesiology

## 2014-09-25 IMAGING — US US OB TRANSVAGINAL
1 series · 14 of 14 positions shown · non-contrast
Comparison: none

[Series 1: us ob transvaginal · 14 acquisitions, 14 frames shown]
[im 1/14]
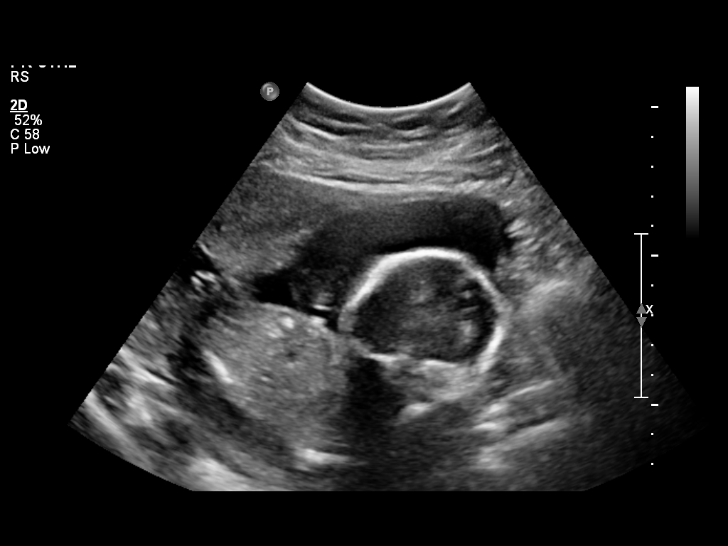
[im 2/14]
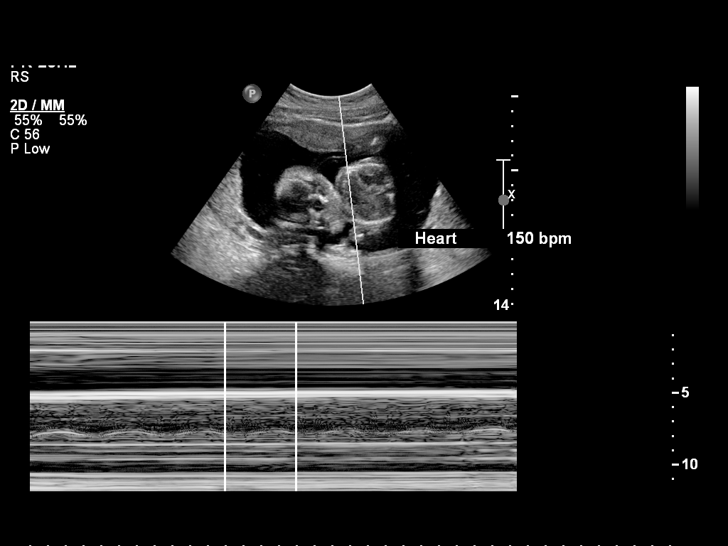
[im 3/14]
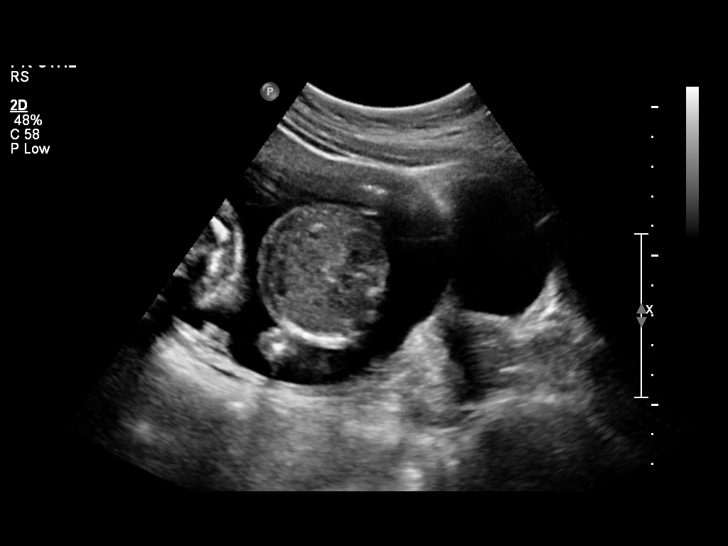
[im 4/14]
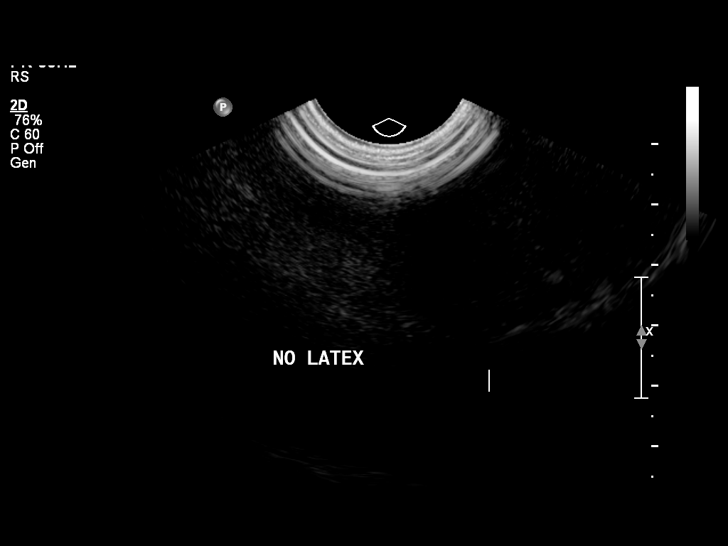
[im 5/14]
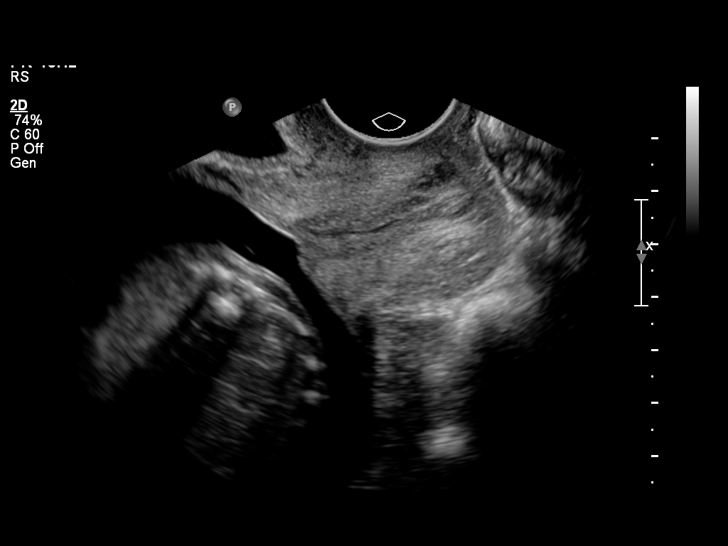
[im 6/14]
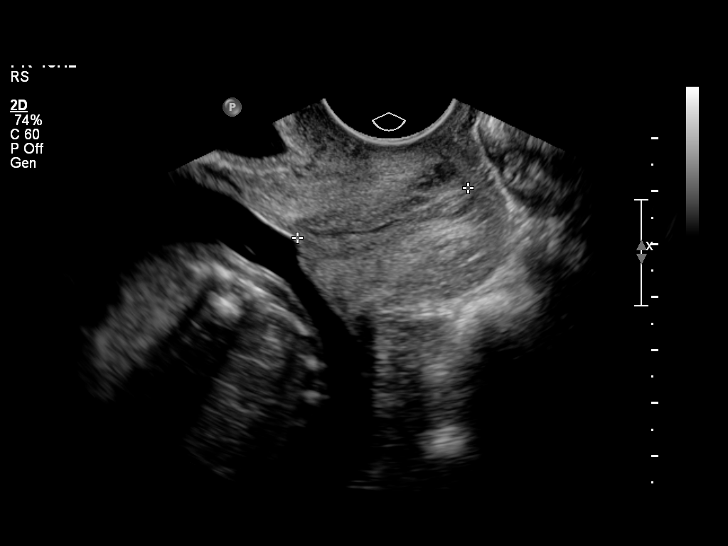
[im 7/14]
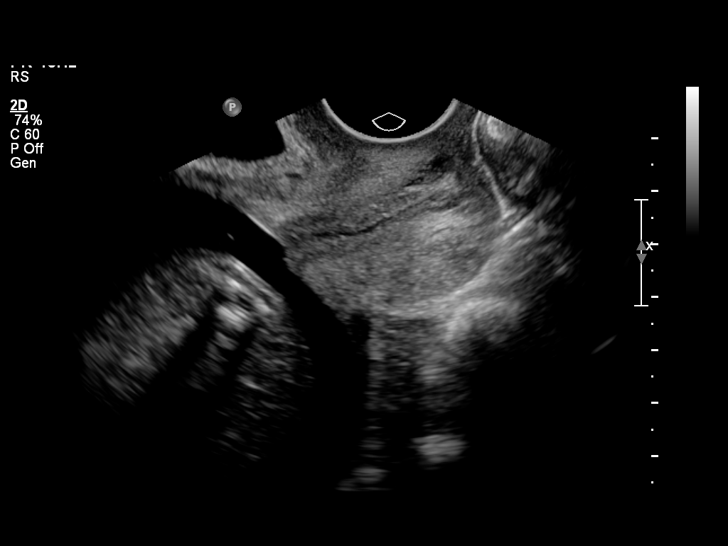
[im 8/14]
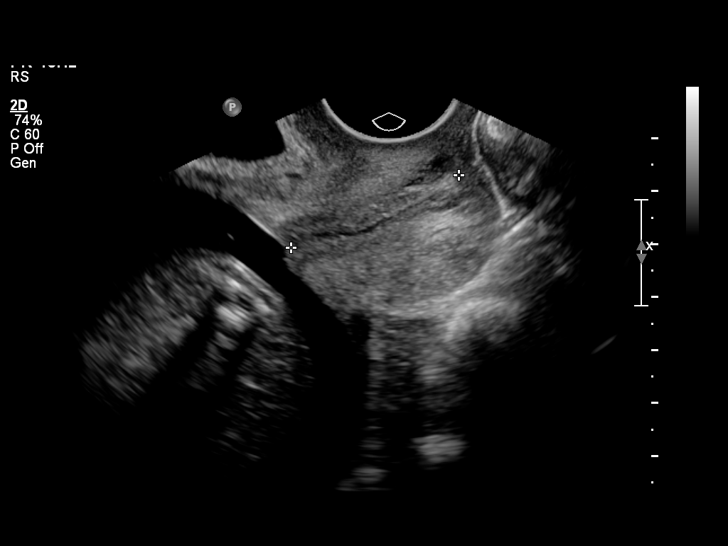
[im 9/14]
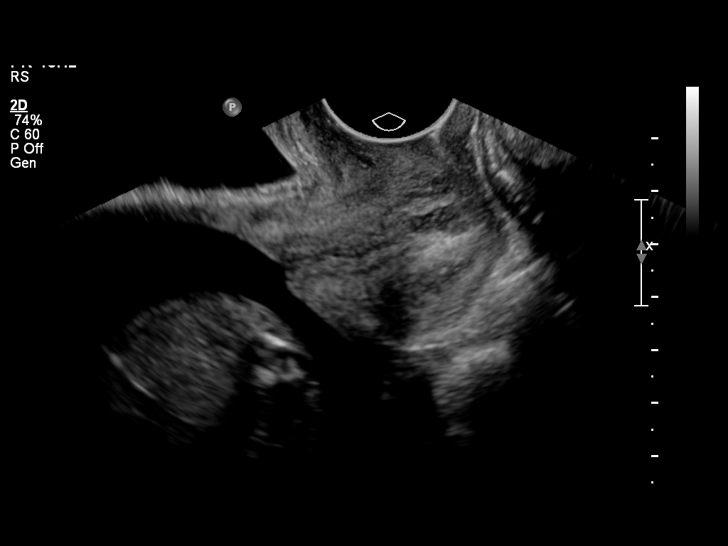
[im 10/14]
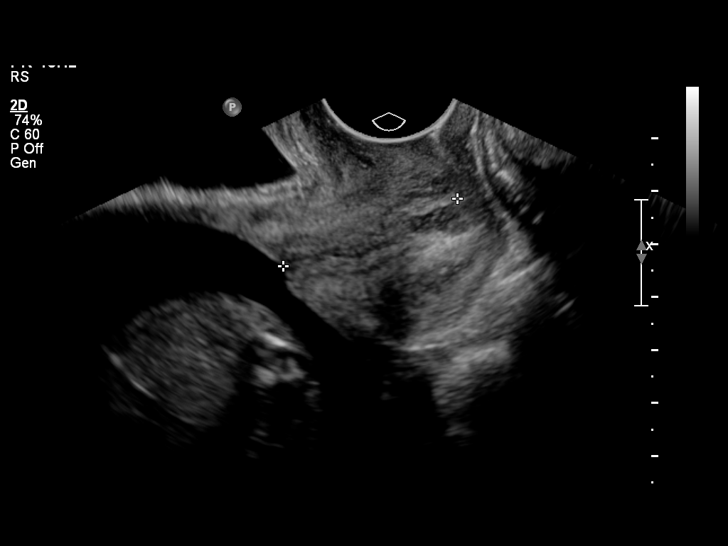
[im 11/14]
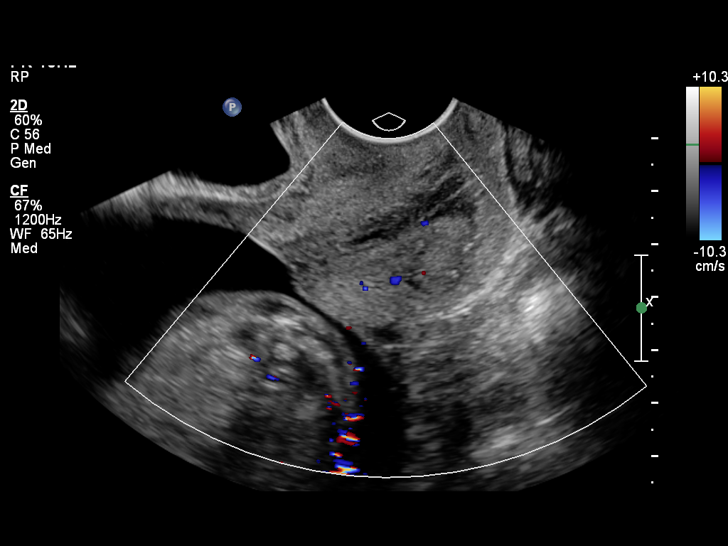
[im 12/14]
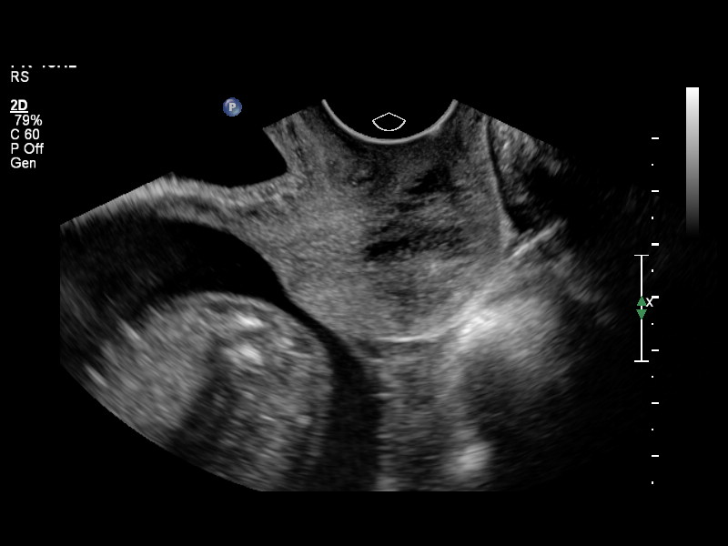
[im 13/14]
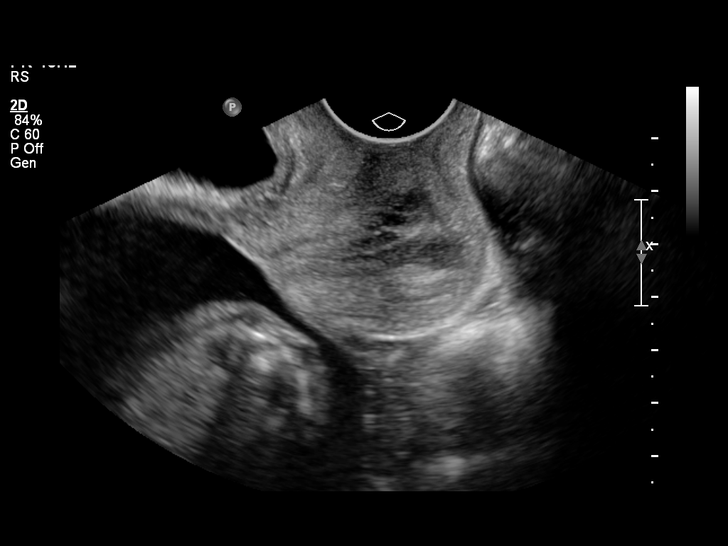
[im 14/14]
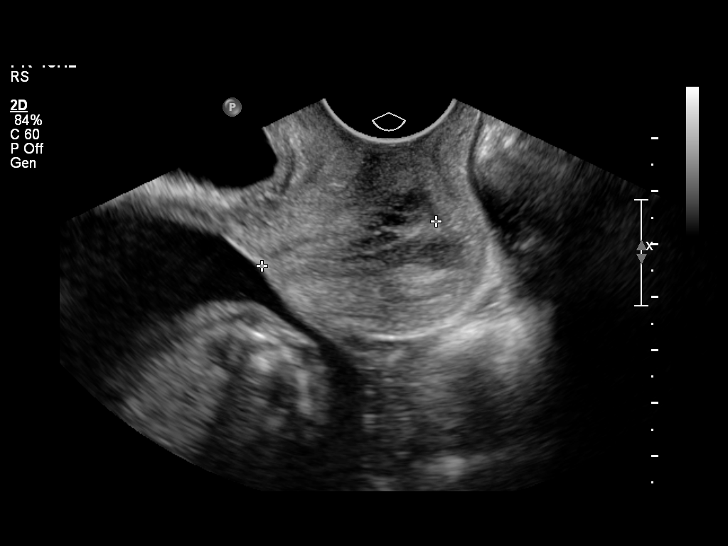

[14 of 14 positions shown; findings below may reference images not displayed]

OBSTETRICS REPORT
                      (Signed Final 09/22/2013 [DATE])

Service(s) Provided

 US OB TRANSVAGINAL                                    76817.0
Indications

 Pelvic / Abdominal pain
 Assess cervical length
Fetal Evaluation

 Num Of Fetuses:    1
 Fetal Heart Rate:  150                          bpm
 Cardiac Activity:  Observed
 Presentation:      Transverse, head to
                    maternal left
 Placenta:          Posterior, above cervical
                    os
Gestational Age

 Clinical EDD:  19w 4d                                        EDD:   02/12/14
 Best:          19w 4d     Det. By:  Clinical EDD             EDD:   02/12/14
Cervix Uterus Adnexa

 Cervical Length:    3.4       cm

 Cervix:       Normal appearance by transvaginal scan
Impression

 Single IUP at 19 [DATE] weeks
 Limited ultrasound performed for cervical length
 TVUS - cervical length 3.4 cm without funneling or dynamic
 changes
 Normal amniotic fluid volume
Recommendations

 Normal cervical length
 Follow up ultrasounds as clinically indicated.
 questions or concerns.

## 2016-03-12 ENCOUNTER — Encounter: Payer: Self-pay | Admitting: Obstetrics and Gynecology

## 2016-03-12 ENCOUNTER — Ambulatory Visit (INDEPENDENT_AMBULATORY_CARE_PROVIDER_SITE_OTHER): Payer: BLUE CROSS/BLUE SHIELD | Admitting: Obstetrics and Gynecology

## 2016-03-12 VITALS — BP 113/83 | HR 109 | Ht 67.0 in | Wt 156.1 lb

## 2016-03-12 DIAGNOSIS — N926 Irregular menstruation, unspecified: Secondary | ICD-10-CM | POA: Diagnosis not present

## 2016-03-12 LAB — POCT URINE PREGNANCY: PREG TEST UR: POSITIVE — AB

## 2016-03-12 NOTE — Progress Notes (Signed)
Subjective:     Patient ID: Tia MaskerWhitney Jose, female   DOB: 1986/05/15, 30 y.o.   MRN: [id number]  HPI Here for pregnancy confirmation with LMP 01/20/16 and EDC 10/28/16 EGA 2479w3d, G3P1011, with previous 10 week SAB and 38 week vacuum assisted vaginal delivery.  Review of Systems Mild nausea and fatigue    Objective:   Physical Exam A&O x4  well groomed female in no distress Blood pressure 113/83, pulse 109, height 5\' 7"  (1.702 m), weight 156 lb 1.6 oz (70.806 kg), last menstrual period 01/20/2016, not currently breastfeeding. +UPT    Assessment:     Missed menses, +UPT     Plan:     Nurse intake and labs with viability scan in 1 week. NOB PE in 4 weeks.  Brinlynn Gorton YelmShambley, CNM

## 2016-03-16 ENCOUNTER — Ambulatory Visit (INDEPENDENT_AMBULATORY_CARE_PROVIDER_SITE_OTHER): Payer: BLUE CROSS/BLUE SHIELD

## 2016-03-16 DIAGNOSIS — N926 Irregular menstruation, unspecified: Secondary | ICD-10-CM

## 2016-03-18 ENCOUNTER — Ambulatory Visit: Payer: BLUE CROSS/BLUE SHIELD | Admitting: Obstetrics and Gynecology

## 2016-03-18 ENCOUNTER — Other Ambulatory Visit: Payer: Self-pay | Admitting: Obstetrics and Gynecology

## 2016-03-18 VITALS — BP 110/71 | HR 103 | Wt 155.0 lb

## 2016-03-18 DIAGNOSIS — Z369 Encounter for antenatal screening, unspecified: Secondary | ICD-10-CM

## 2016-03-18 DIAGNOSIS — Z3490 Encounter for supervision of normal pregnancy, unspecified, unspecified trimester: Secondary | ICD-10-CM

## 2016-03-18 LAB — OB RESULTS CONSOLE VARICELLA ZOSTER ANTIBODY, IGG: VARICELLA IGG: IMMUNE

## 2016-03-18 NOTE — Progress Notes (Signed)
Julia MaskerWhitney Chandler presents for NOB nurse interview visit. G3.  P1. Pregnancy education material explained and given. No cats in the home. NOB labs ordered. HIV labs and Drug screen were explained optional and she could opt out of tests but did not decline. Drug screen ordered/declined. PNV encouraged, pt currrently take vitaFusion prenatal gummies. Pt also taking probiotics and natural supplements for constipation. NT to discuss with provider. Pt. To follow up with provider in 4 weeks for NOB physical.  All questions answered.

## 2016-03-18 NOTE — Patient Instructions (Signed)
Follow up in 3-4wks for NOB Physical, call before if needed

## 2016-03-19 ENCOUNTER — Other Ambulatory Visit: Payer: Self-pay | Admitting: Obstetrics and Gynecology

## 2016-03-19 ENCOUNTER — Encounter: Payer: Self-pay | Admitting: Obstetrics and Gynecology

## 2016-03-19 DIAGNOSIS — O09899 Supervision of other high risk pregnancies, unspecified trimester: Secondary | ICD-10-CM

## 2016-03-19 DIAGNOSIS — Z283 Underimmunization status: Secondary | ICD-10-CM

## 2016-03-19 DIAGNOSIS — O9989 Other specified diseases and conditions complicating pregnancy, childbirth and the puerperium: Principal | ICD-10-CM

## 2016-03-19 LAB — CBC WITH DIFFERENTIAL/PLATELET
BASOS ABS: 0 10*3/uL (ref 0.0–0.2)
Basos: 0 %
EOS (ABSOLUTE): 0 10*3/uL (ref 0.0–0.4)
EOS: 0 %
Hematocrit: 41.6 % (ref 34.0–46.6)
Hemoglobin: 13.5 g/dL (ref 11.1–15.9)
IMMATURE GRANULOCYTES: 0 %
Immature Grans (Abs): 0 10*3/uL (ref 0.0–0.1)
Lymphocytes Absolute: 1.7 10*3/uL (ref 0.7–3.1)
Lymphs: 21 %
MCH: 29 pg (ref 26.6–33.0)
MCHC: 32.5 g/dL (ref 31.5–35.7)
MCV: 90 fL (ref 79–97)
MONOCYTES: 5 %
MONOS ABS: 0.4 10*3/uL (ref 0.1–0.9)
NEUTROS PCT: 74 %
Neutrophils Absolute: 6.2 10*3/uL (ref 1.4–7.0)
Platelets: 389 10*3/uL — ABNORMAL HIGH (ref 150–379)
RBC: 4.65 x10E6/uL (ref 3.77–5.28)
RDW: 14.6 % (ref 12.3–15.4)
WBC: 8.3 10*3/uL (ref 3.4–10.8)

## 2016-03-19 LAB — MONITOR DRUG PROFILE 14(MW)
AMPHETAMINE SCREEN URINE: NEGATIVE ng/mL
BARBITURATE SCREEN URINE: NEGATIVE ng/mL
BENZODIAZEPINE SCREEN, URINE: NEGATIVE ng/mL
Buprenorphine, Urine: NEGATIVE ng/mL
CANNABINOIDS UR QL SCN: NEGATIVE ng/mL
Cocaine (Metab) Scrn, Ur: NEGATIVE ng/mL
Creatinine(Crt), U: 202.2 mg/dL (ref 20.0–300.0)
Fentanyl, Urine: NEGATIVE pg/mL
MEPERIDINE SCREEN, URINE: NEGATIVE ng/mL
Methadone Screen, Urine: NEGATIVE ng/mL
OPIATE SCREEN URINE: NEGATIVE ng/mL
OXYCODONE+OXYMORPHONE UR QL SCN: NEGATIVE ng/mL
Ph of Urine: 5.6 (ref 4.5–8.9)
Phencyclidine Qn, Ur: NEGATIVE ng/mL
Propoxyphene Scrn, Ur: NEGATIVE ng/mL
SPECIFIC GRAVITY: 1.018
TRAMADOL SCREEN, URINE: NEGATIVE ng/mL

## 2016-03-19 LAB — URINALYSIS, ROUTINE W REFLEX MICROSCOPIC
Bilirubin, UA: NEGATIVE
Glucose, UA: NEGATIVE
Leukocytes, UA: NEGATIVE
NITRITE UA: NEGATIVE
PH UA: 6 (ref 5.0–7.5)
Protein, UA: NEGATIVE
RBC, UA: NEGATIVE
Specific Gravity, UA: 1.023 (ref 1.005–1.030)
UUROB: 0.2 mg/dL (ref 0.2–1.0)

## 2016-03-19 LAB — ABO AND RH: Rh Factor: POSITIVE

## 2016-03-19 LAB — URINE CULTURE

## 2016-03-19 LAB — VARICELLA ZOSTER ANTIBODY, IGG: Varicella zoster IgG: 1789 index (ref >165)

## 2016-03-19 LAB — RUBELLA SCREEN: Rubella Antibodies, IGG: <0.9 index — ABNORMAL LOW (ref >0.99)

## 2016-03-19 LAB — HEP, RPR, HIV PANEL
HIV SCREEN 4TH GENERATION: NONREACTIVE
Hepatitis B Surface Ag: NEGATIVE
RPR: NONREACTIVE

## 2016-03-19 LAB — ANTIBODY SCREEN: ANTIBODY SCREEN: NEGATIVE

## 2016-03-20 LAB — GC/CHLAMYDIA PROBE AMP
CHLAMYDIA, DNA PROBE: NEGATIVE
Neisseria gonorrhoeae by PCR: NEGATIVE

## 2016-04-16 ENCOUNTER — Other Ambulatory Visit: Payer: Self-pay | Admitting: Obstetrics and Gynecology

## 2016-04-16 ENCOUNTER — Encounter: Payer: Self-pay | Admitting: Obstetrics and Gynecology

## 2016-04-16 ENCOUNTER — Ambulatory Visit (INDEPENDENT_AMBULATORY_CARE_PROVIDER_SITE_OTHER): Payer: BLUE CROSS/BLUE SHIELD | Admitting: Obstetrics and Gynecology

## 2016-04-16 VITALS — BP 98/70 | HR 81 | Wt 151.5 lb

## 2016-04-16 DIAGNOSIS — O9989 Other specified diseases and conditions complicating pregnancy, childbirth and the puerperium: Secondary | ICD-10-CM

## 2016-04-16 DIAGNOSIS — Z283 Underimmunization status: Secondary | ICD-10-CM

## 2016-04-16 DIAGNOSIS — Z2839 Other underimmunization status: Secondary | ICD-10-CM

## 2016-04-16 DIAGNOSIS — Z3493 Encounter for supervision of normal pregnancy, unspecified, third trimester: Secondary | ICD-10-CM

## 2016-04-16 LAB — POCT URINALYSIS DIPSTICK
BILIRUBIN UA: NEGATIVE
GLUCOSE UA: NEGATIVE
Ketones, UA: 5
Leukocytes, UA: NEGATIVE
NITRITE UA: NEGATIVE
Protein, UA: NEGATIVE
RBC UA: NEGATIVE
Spec Grav, UA: 1.015
UROBILINOGEN UA: 0.2
pH, UA: 6

## 2016-04-16 NOTE — Patient Instructions (Signed)

## 2016-04-16 NOTE — Progress Notes (Signed)
NOB physical- pt is doing well, fatigue

## 2016-04-16 NOTE — Progress Notes (Signed)
NEW OB HISTORY AND PHYSICAL  SUBJECTIVE:       Tia MaskerWhitney Carlberg is a 30 y.o. 672P1011 female, Patient's last menstrual period was 01/20/2016., Estimated Date of Delivery: 11/03/16, 4045w2d, presents today for establishment of Prenatal Care. She has no unusual complaints and complains of fatigue      Gynecologic History Patient's last menstrual period was 01/20/2016. Normal Contraception: none Last Pap: 2015. Results were: normal  Obstetric History OB History  Gravida Para Term Preterm AB SAB TAB Ectopic Multiple Living  2 1 1  1 1    1     # Outcome Date GA Lbr Len/2nd Weight Sex Delivery Anes PTL Lv  2 Term 02/08/14 3241w3d 12:30 / 02:34 6 lb 13.7 oz (3.11 kg) F Vag-Vacuum EPI  Y  1 SAB 08/11/11              Past Medical History  Diagnosis Date  . Headache(784.0)   . Preterm labor   . Ovarian cyst   . Vaginal Pap smear, abnormal     f/u ok    Past Surgical History  Procedure Laterality Date  . Wisdom tooth extraction      Current Outpatient Prescriptions on File Prior to Visit  Medication Sig Dispense Refill  . FIBER COMPLETE PO Take by mouth.    . Prenatal MV-Min-Fe Fum-FA-DHA (PRENATAL 1 PO) Take by mouth.     No current facility-administered medications on file prior to visit.    Allergies  Allergen Reactions  . Vicodin [Hydrocodone-Acetaminophen] Rash  . Percocet [Oxycodone-Acetaminophen] Rash    Social History   Social History  . Marital Status: Married    Spouse Name: N/A  . Number of Children: N/A  . Years of Education: N/A   Occupational History  . Not on file.   Social History Main Topics  . Smoking status: Never Smoker   . Smokeless tobacco: Never Used  . Alcohol Use: No  . Drug Use: No  . Sexual Activity: Yes    Birth Control/ Protection: None   Other Topics Concern  . Not on file   Social History Narrative    Family History  Problem Relation Age of Onset  . Cancer Mother   . Miscarriages / [place] Mother   . Hypertension Father    . Anxiety disorder Sister   . Thyroid disease Maternal Grandmother   . Cancer Maternal Grandfather   . Heart disease Paternal Grandmother   . Hypertension Paternal Grandmother   . Heart disease Paternal Grandfather   . Hypertension Paternal Grandfather     The following portions of the patient's history were reviewed and updated as appropriate: allergies, current medications, past OB history, past medical history, past surgical history, past family history, past social history, and problem list.    OBJECTIVE: Initial Physical Exam (New OB)  GENERAL APPEARANCE: alert, well appearing, in no apparent distress, oriented to person, place and time HEAD: normocephalic, atraumatic MOUTH: mucous membranes moist, pharynx normal without lesions and dental hygiene good THYROID: no thyromegaly or masses present BREASTS: no masses noted, no significant tenderness, no palpable axillary nodes, no skin changes LUNGS: clear to auscultation, no wheezes, rales or rhonchi, symmetric air entry HEART: regular rate and rhythm, no murmurs ABDOMEN: soft, nontender, nondistended, no abnormal masses, no epigastric pain and fundus not palpable EXTREMITIES: no redness or tenderness in the calves or thighs SKIN: normal coloration and turgor, no rashes LYMPH NODES: no adenopathy palpable NEUROLOGIC: alert, oriented, normal speech, no focal findings or movement disorder noted  PELVIC EXAM EXTERNAL GENITALIA: normal appearing vulva with no masses, tenderness or lesions CERVIX: no lesions or cervical motion tenderness UTERUS: gravid and consistent with 12 weeks ADNEXA: no masses palpable and nontender  ASSESSMENT: Normal pregnancy  PLAN: Prenatal care Desires genetic screening See orders

## 2016-04-21 LAB — CYTOLOGY - PAP

## 2016-04-28 ENCOUNTER — Telehealth: Payer: Self-pay | Admitting: *Deleted

## 2016-04-28 ENCOUNTER — Other Ambulatory Visit: Payer: Self-pay | Admitting: Obstetrics and Gynecology

## 2016-04-28 ENCOUNTER — Encounter: Payer: Self-pay | Admitting: Obstetrics and Gynecology

## 2016-04-28 NOTE — Telephone Encounter (Signed)
-----   Message from Purcell NailsMelody N Shambley, [place] sent at 04/28/2016  2:22 PM EDT ----- Please give panarama results- LM for her to call-low risk female

## 2016-04-28 NOTE — Telephone Encounter (Signed)
Notified pt of results 

## 2016-05-05 ENCOUNTER — Encounter: Payer: Self-pay | Admitting: Obstetrics and Gynecology

## 2016-05-21 ENCOUNTER — Ambulatory Visit (INDEPENDENT_AMBULATORY_CARE_PROVIDER_SITE_OTHER): Payer: Self-pay | Admitting: Obstetrics and Gynecology

## 2016-05-21 VITALS — BP 102/74 | HR 86 | Wt 152.6 lb

## 2016-05-21 DIAGNOSIS — Z3492 Encounter for supervision of normal pregnancy, unspecified, second trimester: Secondary | ICD-10-CM

## 2016-05-21 LAB — POCT URINALYSIS DIPSTICK
BILIRUBIN UA: NEGATIVE
GLUCOSE UA: NEGATIVE
Ketones, UA: 5
Leukocytes, UA: NEGATIVE
Nitrite, UA: NEGATIVE
Protein, UA: NEGATIVE
RBC UA: NEGATIVE
SPEC GRAV UA: 1.02
Urobilinogen, UA: 0.2
pH, UA: 6

## 2016-05-21 NOTE — Progress Notes (Signed)
ROB- pt is doing well 

## 2016-05-21 NOTE — Progress Notes (Signed)
ROB-only complaint is constipation- to add Miralax qod.

## 2016-06-18 ENCOUNTER — Ambulatory Visit (INDEPENDENT_AMBULATORY_CARE_PROVIDER_SITE_OTHER): Payer: Self-pay

## 2016-06-18 DIAGNOSIS — Z3492 Encounter for supervision of normal pregnancy, unspecified, second trimester: Secondary | ICD-10-CM

## 2016-06-25 ENCOUNTER — Ambulatory Visit (INDEPENDENT_AMBULATORY_CARE_PROVIDER_SITE_OTHER): Payer: Self-pay | Admitting: Obstetrics and Gynecology

## 2016-06-25 VITALS — BP 109/71 | HR 117 | Wt 156.9 lb

## 2016-06-25 DIAGNOSIS — Z3492 Encounter for supervision of normal pregnancy, unspecified, second trimester: Secondary | ICD-10-CM

## 2016-06-25 LAB — POCT URINALYSIS DIPSTICK
Blood, UA: NEGATIVE
GLUCOSE UA: NEGATIVE
KETONES UA: NEGATIVE
Leukocytes, UA: NEGATIVE
Nitrite, UA: NEGATIVE
SPEC GRAV UA: 1.025
Urobilinogen, UA: 0.2
pH, UA: 6

## 2016-06-25 NOTE — Progress Notes (Signed)
ROB-discussed stress management, good support, reassured about fetal growth.

## 2016-06-25 NOTE — Progress Notes (Signed)
ROB- pt has a lot of stress, mom in ER, dad in hospice, trying to hold it together

## 2016-07-23 ENCOUNTER — Encounter: Payer: Self-pay | Admitting: Obstetrics and Gynecology

## 2016-07-23 ENCOUNTER — Ambulatory Visit (INDEPENDENT_AMBULATORY_CARE_PROVIDER_SITE_OTHER): Payer: Self-pay | Admitting: Obstetrics and Gynecology

## 2016-07-23 VITALS — BP 118/74 | HR 113 | Wt 160.4 lb

## 2016-07-23 DIAGNOSIS — Z3492 Encounter for supervision of normal pregnancy, unspecified, second trimester: Secondary | ICD-10-CM

## 2016-07-23 LAB — POCT URINALYSIS DIPSTICK
Bilirubin, UA: NEGATIVE
Blood, UA: NEGATIVE
GLUCOSE UA: NEGATIVE
Ketones, UA: NEGATIVE
LEUKOCYTES UA: NEGATIVE
NITRITE UA: NEGATIVE
Spec Grav, UA: 1.015
UROBILINOGEN UA: 0.2
pH, UA: 6

## 2016-07-23 NOTE — Patient Instructions (Signed)

## 2016-07-23 NOTE — Progress Notes (Signed)
ROB- reports increased pressure with irregular cramping x 1 week, fearful of PTL starting again (started at 30 weeks last time) FFN obtained and reassured. Will return next week for flu shot and 3 weeks for glucola.

## 2016-07-23 NOTE — Progress Notes (Signed)
ROB- pt is worried about pre-term labor, she has a lot of stress on her, parents are sick, she has been having some contractions, would like some reassuring

## 2016-07-24 LAB — FETAL FIBRONECTIN: Fetal Fibronectin: NEGATIVE

## 2016-07-30 ENCOUNTER — Encounter: Payer: Self-pay | Admitting: Obstetrics and Gynecology

## 2016-07-30 ENCOUNTER — Ambulatory Visit (INDEPENDENT_AMBULATORY_CARE_PROVIDER_SITE_OTHER): Payer: Self-pay | Admitting: Obstetrics and Gynecology

## 2016-07-30 VITALS — BP 102/75 | HR 106 | Wt 160.7 lb

## 2016-07-30 DIAGNOSIS — J020 Streptococcal pharyngitis: Secondary | ICD-10-CM

## 2016-07-30 DIAGNOSIS — J029 Acute pharyngitis, unspecified: Secondary | ICD-10-CM

## 2016-07-30 DIAGNOSIS — Z3492 Encounter for supervision of normal pregnancy, unspecified, second trimester: Secondary | ICD-10-CM

## 2016-07-30 LAB — POCT URINALYSIS DIPSTICK
Blood, UA: NEGATIVE
Glucose, UA: NEGATIVE
KETONES UA: NEGATIVE
LEUKOCYTES UA: NEGATIVE
Nitrite, UA: NEGATIVE
PH UA: 6.5
Spec Grav, UA: 1.01
Urobilinogen, UA: 0.2

## 2016-07-30 LAB — POCT RAPID STREP A (OFFICE): Rapid Strep A Screen: POSITIVE — AB

## 2016-07-30 MED ORDER — CEFDINIR 300 MG PO CAPS: 300.0000 mg | ORAL_CAPSULE | Freq: Two times a day (BID) | ORAL | 1 refills | Status: DC

## 2016-07-30 NOTE — Patient Instructions (Signed)

## 2016-07-30 NOTE — Progress Notes (Signed)
Subjective:     Patient ID: Julia MaskerWhitney Chandler, female   DOB: [birth year], 30 y.o.   MRN: [id number]  HPI Woke up this morning with a sore throat, denies fever or other symptoms. Baby is moving well. Just feels real tired.  Review of Systems Negative except stated in HPI    Objective:   Physical Exam A&O x4 Well groomed female in mild distress Vitals:   07/30/16 1006  Weight: 160 lb 11 oz (72.9 kg)  + rapid strep test Throat red with small white patches    Assessment:     Strep throat    Plan:     Omnicef called in x7d, OK to take extra strength tylenol, rest & push fluids.  Julia Chandler, CNM

## 2016-07-30 NOTE — Progress Notes (Signed)
OB WORK IN- pt woke up this am about 3:00 very sore throat

## 2016-08-03 ENCOUNTER — Telehealth: Payer: Self-pay | Admitting: Obstetrics and Gynecology

## 2016-08-03 NOTE — Telephone Encounter (Signed)
Pt called and she was given an antibiotic from you and she now has a yeast infection and wanted to know if you could call something in for her to the [facility] in [place].

## 2016-08-04 ENCOUNTER — Other Ambulatory Visit: Payer: Self-pay | Admitting: Obstetrics and Gynecology

## 2016-08-04 MED ORDER — FLUCONAZOLE 150 MG PO TABS: 150.0000 mg | ORAL_TABLET | Freq: Once | ORAL | 3 refills | Status: AC

## 2016-08-04 NOTE — Telephone Encounter (Signed)
Notified pt. 

## 2016-08-04 NOTE — Telephone Encounter (Signed)
I sent in a prescription for Diflucan.

## 2016-08-11 ENCOUNTER — Other Ambulatory Visit: Payer: Self-pay | Admitting: *Deleted

## 2016-08-11 DIAGNOSIS — Z131 Encounter for screening for diabetes mellitus: Secondary | ICD-10-CM

## 2016-08-13 ENCOUNTER — Other Ambulatory Visit: Payer: Self-pay

## 2016-08-13 ENCOUNTER — Ambulatory Visit (INDEPENDENT_AMBULATORY_CARE_PROVIDER_SITE_OTHER): Payer: Self-pay | Admitting: Obstetrics and Gynecology

## 2016-08-13 VITALS — BP 102/63 | HR 94 | Wt 162.5 lb

## 2016-08-13 DIAGNOSIS — Z131 Encounter for screening for diabetes mellitus: Secondary | ICD-10-CM

## 2016-08-13 DIAGNOSIS — Z3493 Encounter for supervision of normal pregnancy, unspecified, third trimester: Secondary | ICD-10-CM

## 2016-08-13 LAB — POCT URINALYSIS DIPSTICK
Bilirubin, UA: NEGATIVE
Blood, UA: NEGATIVE
Glucose, UA: NEGATIVE
Ketones, UA: NEGATIVE
LEUKOCYTES UA: NEGATIVE
NITRITE UA: NEGATIVE
Spec Grav, UA: 1.01
UROBILINOGEN UA: 0.2
pH, UA: 7

## 2016-08-13 NOTE — Patient Instructions (Signed)

## 2016-08-13 NOTE — Progress Notes (Signed)
ROB-feeling much better, discussed cord blood banking/donation, flu vaccine given, also considering placental capsule- info given

## 2016-08-13 NOTE — Progress Notes (Signed)
ROB-glucola done today, blood consent signed,pt is doing well, feeling some better

## 2016-08-14 LAB — GLUCOSE, 1 HOUR GESTATIONAL: Gestational Diabetes Screen: 105 mg/dL (ref 65–139)

## 2016-08-16 ENCOUNTER — Encounter: Payer: Self-pay | Admitting: Obstetrics and Gynecology

## 2016-08-17 ENCOUNTER — Telehealth: Payer: Self-pay

## 2016-08-17 ENCOUNTER — Ambulatory Visit (INDEPENDENT_AMBULATORY_CARE_PROVIDER_SITE_OTHER): Payer: Self-pay | Admitting: Obstetrics and Gynecology

## 2016-08-17 VITALS — BP 104/63 | HR 80 | Wt 162.4 lb

## 2016-08-17 DIAGNOSIS — Z1389 Encounter for screening for other disorder: Secondary | ICD-10-CM

## 2016-08-17 DIAGNOSIS — O479 False labor, unspecified: Secondary | ICD-10-CM

## 2016-08-17 DIAGNOSIS — Z369 Encounter for antenatal screening, unspecified: Secondary | ICD-10-CM

## 2016-08-17 DIAGNOSIS — O4700 False labor before 37 completed weeks of gestation, unspecified trimester: Secondary | ICD-10-CM

## 2016-08-17 LAB — POCT URINALYSIS DIPSTICK
Bilirubin, UA: NEGATIVE
Blood, UA: NEGATIVE
Glucose, UA: NEGATIVE
KETONES UA: NEGATIVE
Nitrite, UA: NEGATIVE
Spec Grav, UA: 1.025
Urobilinogen, UA: NEGATIVE
pH, UA: 6

## 2016-08-17 MED ORDER — BETAMETHASONE SOD PHOS & ACET 6 (3-3) MG/ML IJ SUSP
12.0000 mg | Freq: Once | INTRAMUSCULAR | Status: AC
  Administered 2016-08-17: 12 mg via INTRAMUSCULAR

## 2016-08-17 NOTE — Telephone Encounter (Signed)
Pt calling stating that for last 2 days (yesterday and day before her contractions) were so uncomfortable she has had to lie down. Since then they have gotten worse. It took . Yesterday to stop contractions. Contractions are inconsistent as far as timing. No c/o vaginal bleeding and no leakage of fluid. Has hx PTL but did carry the baby until 39 wks.  Pt to come to office for NST and to see provider today at 10:30am.

## 2016-08-17 NOTE — Progress Notes (Signed)
Pt called this am with c/o contractions yesterday and the day before of which are getting worse. They get so uncomfortable she has to lie down. FM positive. Last night it took 45 min for pain to go away. Contractions come and go and are not consistent. No vaginal bleeding or leakage of fluid. Has history of PTL but did carry pregnancy to 39 wks. Also having bad headaches and slight swelling of feet. Needs to wear glasses all the time now.    NONSTRESS TEST INTERPRETATION  INDICATIONS: hx PTL, C/O recent contractions  FHR baseline: 140 RESULTS: reactive COMMENTS: NST B/P's:  B/P- 106/59  P-95;   B/P- 97/59  P-90;  B/P-102/56   P-93.  Physical exam: Patient reports some increased secretions without vaginal bleeding or gush of fluid Fundal height 28 cm; uterus nontender; vertex by Thayer OhmLeopold maneuvers Fetal fibronectin swab obtained Cervix: fingertip/50%/firm/-1/vertex    PLAN: 1. Continue fetal kick counts twice a day. 2. Continue antepartum testing -Biweekly 3. Limited activity; pelvic rest; maintain hydration; preterm labor precautions 4. Betamethasone 12.5 mg IM every 24 hours 2  Fenton Mallingebbie Ridgeway, LPN  Herold HarmsMartin A Tracina Beaumont, MD

## 2016-08-17 NOTE — Patient Instructions (Signed)

## 2016-08-18 ENCOUNTER — Ambulatory Visit (INDEPENDENT_AMBULATORY_CARE_PROVIDER_SITE_OTHER): Payer: Self-pay

## 2016-08-18 DIAGNOSIS — O479 False labor, unspecified: Secondary | ICD-10-CM

## 2016-08-18 DIAGNOSIS — O4700 False labor before 37 completed weeks of gestation, unspecified trimester: Secondary | ICD-10-CM

## 2016-08-18 LAB — FETAL FIBRONECTIN: FETAL FIBRONECTIN: NEGATIVE

## 2016-08-18 MED ORDER — BETAMETHASONE SOD PHOS & ACET 6 (3-3) MG/ML IJ SUSP
12.0000 mg | Freq: Once | INTRAMUSCULAR | Status: AC
  Administered 2016-08-18: 12 mg via INTRAMUSCULAR

## 2016-08-18 NOTE — Progress Notes (Signed)
Pt presents for 2nd betamethasone injection. Had some contractions last night, no different than before. Does have help now with her 30yo and is able to lie down most of time. To contact office if any changes.

## 2016-08-19 ENCOUNTER — Ambulatory Visit (INDEPENDENT_AMBULATORY_CARE_PROVIDER_SITE_OTHER): Payer: Self-pay | Admitting: Obstetrics and Gynecology

## 2016-08-19 VITALS — BP 100/51 | HR 91 | Wt 162.7 lb

## 2016-08-19 DIAGNOSIS — Z369 Encounter for antenatal screening, unspecified: Secondary | ICD-10-CM

## 2016-08-19 DIAGNOSIS — O479 False labor, unspecified: Secondary | ICD-10-CM

## 2016-08-19 DIAGNOSIS — O4700 False labor before 37 completed weeks of gestation, unspecified trimester: Secondary | ICD-10-CM

## 2016-08-19 DIAGNOSIS — Z1389 Encounter for screening for other disorder: Secondary | ICD-10-CM

## 2016-08-19 LAB — POCT URINALYSIS DIPSTICK
Bilirubin, UA: NEGATIVE
GLUCOSE UA: NEGATIVE
Ketones, UA: NEGATIVE
Nitrite, UA: NEGATIVE
PH UA: 6
RBC UA: NEGATIVE
SPEC GRAV UA: 1.01
UROBILINOGEN UA: NEGATIVE

## 2016-08-19 NOTE — Progress Notes (Signed)
NONSTRESS TEST INTERPRETATION  INDICATIONS:  PRETERM CONTRACTIONS  FHR baseline: 150 RESULTS: reactive, no regular contractions COMMENTS:  Fetal fibronectin negative; irregular infrequent contractions on history  Cervix: Fingertip/50%/firm/-3 (unchanged)  PLAN: 1. Continue fetal kick counts twice a day. 2. Continue antepartum testing as scheduled-Biweekly 3. RTO as scheduled. 4. Limited activity, pelvic rest, maintain hydration, preterm labor precautions.  Fenton Mallingebbie Ridgeway, LPN Herold HarmsMartin A Shaman Muscarella, MD  Note: This dictation was prepared with Dragon dictation along with smaller phrase technology. Any transcriptional errors that result from this process are unintentional.

## 2016-08-24 ENCOUNTER — Ambulatory Visit (INDEPENDENT_AMBULATORY_CARE_PROVIDER_SITE_OTHER): Payer: Self-pay | Admitting: Obstetrics and Gynecology

## 2016-08-24 VITALS — BP 106/51 | HR 109 | Wt 164.5 lb

## 2016-08-24 DIAGNOSIS — O4700 False labor before 37 completed weeks of gestation, unspecified trimester: Secondary | ICD-10-CM

## 2016-08-24 DIAGNOSIS — Z1389 Encounter for screening for other disorder: Secondary | ICD-10-CM

## 2016-08-24 DIAGNOSIS — O479 False labor, unspecified: Secondary | ICD-10-CM

## 2016-08-24 DIAGNOSIS — Z369 Encounter for antenatal screening, unspecified: Secondary | ICD-10-CM

## 2016-08-24 LAB — POCT URINALYSIS DIPSTICK
BILIRUBIN UA: NEGATIVE
Blood, UA: NEGATIVE
LEUKOCYTES UA: NEGATIVE
NITRITE UA: NEGATIVE
PH UA: 6
Spec Grav, UA: 1.02
Urobilinogen, UA: NEGATIVE

## 2016-08-24 NOTE — Progress Notes (Signed)
NONSTRESS TEST INTERPRETATION  INDICATIONS:  Preterm contractions  FHR baseline: 140-150 RESULTS: reactive COMMENTS:  NST B/P's: B/P-103/52, P-102;  B/P-100/52,  P-102;  B/P-99/55,  P-99    PLAN: 1. Continue fetal kick counts twice a day. 2. Continue antepartum testing as scheduled-Biweekly 3. RTO as scheduled 4. Limited activity, pelvic rest, maintain hydration, preterm labor precautions.  Julia Mallingebbie Arush Gatliff, LPN

## 2016-08-27 ENCOUNTER — Ambulatory Visit (INDEPENDENT_AMBULATORY_CARE_PROVIDER_SITE_OTHER): Payer: Self-pay | Admitting: Obstetrics and Gynecology

## 2016-08-27 VITALS — BP 109/64 | HR 115 | Wt 159.6 lb

## 2016-08-27 DIAGNOSIS — Z1389 Encounter for screening for other disorder: Secondary | ICD-10-CM

## 2016-08-27 DIAGNOSIS — Z369 Encounter for antenatal screening, unspecified: Secondary | ICD-10-CM

## 2016-08-27 LAB — POCT URINALYSIS DIPSTICK
BILIRUBIN UA: NEGATIVE
Blood, UA: NEGATIVE
GLUCOSE UA: NEGATIVE
KETONES UA: NEGATIVE
Nitrite, UA: NEGATIVE
Protein, UA: NEGATIVE
SPEC GRAV UA: 1.01
Urobilinogen, UA: NEGATIVE
pH, UA: 6.5

## 2016-08-27 NOTE — Progress Notes (Signed)
ROB & NST for contractions- reports less contractions and mild when she has them, intermittent decreased fetal mov't noted, but aware that baby is moving on monitor and that she is not feeling those either. Will release from bedrest but continue pelvic rest.

## 2016-08-27 NOTE — Progress Notes (Signed)
NONSTRESS TEST INTERPRETATION  INDICATIONS: preterm contractions  FHR baseline: 140 RESULTS: reactive COMMENTS: B/P-114/58  P-110,  B/P-112/60  P-108  PLAN: 1. Continue fetal kick counts twice a day. 2. Continue antepartum testing as scheduled-Biweekly 3. RTO as scheduled  Fenton Mallingebbie Tawonda Legaspi, LPN

## 2016-08-30 ENCOUNTER — Inpatient Hospital Stay
Admission: EM | Admit: 2016-08-30 | Discharge: 2016-08-30 | Disposition: A | Discharge status: Home / Self Care | Payer: Self-pay | Attending: Obstetrics and Gynecology | Admitting: Obstetrics and Gynecology

## 2016-08-30 DIAGNOSIS — Z283 Underimmunization status: Secondary | ICD-10-CM

## 2016-08-30 DIAGNOSIS — Z3A30 30 weeks gestation of pregnancy: Secondary | ICD-10-CM | POA: Insufficient documentation

## 2016-08-30 DIAGNOSIS — O9989 Other specified diseases and conditions complicating pregnancy, childbirth and the puerperium: Secondary | ICD-10-CM

## 2016-08-30 DIAGNOSIS — O09899 Supervision of other high risk pregnancies, unspecified trimester: Secondary | ICD-10-CM

## 2016-08-30 MED ORDER — TERBUTALINE SULFATE 1 MG/ML IJ SOLN
0.2500 mg | Freq: Once | INTRAMUSCULAR | Status: AC
  Administered 2016-08-30: 0.25 mg via SUBCUTANEOUS
  Filled 2016-08-30: qty 1

## 2016-08-30 MED ORDER — TERBUTALINE SULFATE 2.5 MG PO TABS
2.5000 mg | ORAL_TABLET | Freq: Four times a day (QID) | ORAL | Status: DC
  Filled 2016-08-30 (×4): qty 1

## 2016-08-30 NOTE — OB Triage Provider Note (Signed)
L&D OB Triage Note  Julia Chandler is a 30 y.o. 803P1011 female at 4073w5d, EDD Estimated Date of Delivery: 11/03/16 who presented to triage for complaints of irregular contractions with rectal pressure since 1500.  She was evaluated by the myself with no significant findings/findings significant for PTL. Vital signs stable. An NST was performed and has been reviewed. Cervix is FT/thick/OOP with scant spotting on exam. She was treated with po terbutaline for mild uterine irritability.     NST INTERPRETATION: Indications: rule out uterine contractions  Mode: External Baseline Rate (A): 130 bpm Variability: Moderate Accelerations: 15 x 15 Decelerations: None     Contraction Frequency (min): none noted   Impression: reactive   Plan: NST performed was reviewed and was found to be reactive. She was discharged home with bleeding/labor precautions, and a prescription for terbutaline 2.5mg  po q6hr prn use and instructed on use/side effects.  Continue routine prenatal care. Follow up with OB/GYN as previously scheduled.     Benett Swoyer Suzan NailerN Helaine Yackel, CNM

## 2016-08-30 NOTE — OB Triage Note (Signed)
Patient came in c/o of contractions that began at 1500 today. States they are every 5-8 mins apart. Denies vaginal bleeding and dischrage. Reports positive fetal movement.

## 2016-09-03 ENCOUNTER — Ambulatory Visit (INDEPENDENT_AMBULATORY_CARE_PROVIDER_SITE_OTHER): Payer: Self-pay | Admitting: Obstetrics and Gynecology

## 2016-09-03 VITALS — BP 100/65 | HR 102 | Wt 165.5 lb

## 2016-09-03 DIAGNOSIS — Z3493 Encounter for supervision of normal pregnancy, unspecified, third trimester: Secondary | ICD-10-CM

## 2016-09-03 LAB — POCT URINALYSIS DIPSTICK
BILIRUBIN UA: NEGATIVE
Blood, UA: NEGATIVE
Glucose, UA: NEGATIVE
KETONES UA: NEGATIVE
LEUKOCYTES UA: NEGATIVE
Nitrite, UA: NEGATIVE
PH UA: 6
Spec Grav, UA: 1.015
Urobilinogen, UA: 0.2

## 2016-09-03 NOTE — Progress Notes (Signed)
ROB- reports feeling well. Seen in L&D Monday night with contractions, sent home with PO Turb. Used Wednesday only this week, felt "hung over" afterwards. Has not taken since and has had only a few contractions that felt more like CSX CorporationBraxton Hicks. Denies vaginal bleeding, itching, urinary symptoms or discharge. Feeling more movement.

## 2016-09-03 NOTE — Progress Notes (Signed)
ROB- pt is doing well, has been resting as much as possible

## 2016-09-03 NOTE — Patient Instructions (Signed)
Place 24-38 weeks prenatal visit patient instructions here.

## 2016-09-03 NOTE — Progress Notes (Signed)
ROB- doing better, will continue prn terbutaline po use and pelvic rest.

## 2016-09-10 ENCOUNTER — Encounter: Payer: Self-pay | Admitting: Obstetrics and Gynecology

## 2016-09-15 ENCOUNTER — Ambulatory Visit (INDEPENDENT_AMBULATORY_CARE_PROVIDER_SITE_OTHER): Payer: Self-pay | Admitting: Obstetrics and Gynecology

## 2016-09-15 VITALS — BP 102/67 | HR 102 | Wt 167.3 lb

## 2016-09-15 DIAGNOSIS — Z3493 Encounter for supervision of normal pregnancy, unspecified, third trimester: Secondary | ICD-10-CM

## 2016-09-15 LAB — POCT URINALYSIS DIPSTICK
BILIRUBIN UA: NEGATIVE
Glucose, UA: NEGATIVE
Ketones, UA: 15
Leukocytes, UA: NEGATIVE
NITRITE UA: NEGATIVE
PH UA: 6
RBC UA: NEGATIVE
Spec Grav, UA: 1.015
UROBILINOGEN UA: 0.2

## 2016-09-15 NOTE — Progress Notes (Signed)
ROB-doing well, denies anything different.

## 2016-09-15 NOTE — Progress Notes (Signed)
ROB- pt is doing ok, is having a lot of menstrual cramping, pressure in her rectum

## 2016-09-27 ENCOUNTER — Telehealth: Payer: Self-pay | Admitting: Obstetrics and Gynecology

## 2016-09-28 ENCOUNTER — Ambulatory Visit (INDEPENDENT_AMBULATORY_CARE_PROVIDER_SITE_OTHER): Payer: Self-pay | Admitting: Obstetrics and Gynecology

## 2016-09-28 VITALS — BP 111/69 | HR 100 | Wt 171.3 lb

## 2016-09-28 DIAGNOSIS — Z3685 Encounter for antenatal screening for Streptococcus B: Secondary | ICD-10-CM

## 2016-09-28 DIAGNOSIS — Z113 Encounter for screening for infections with a predominantly sexual mode of transmission: Secondary | ICD-10-CM

## 2016-09-28 DIAGNOSIS — Z3493 Encounter for supervision of normal pregnancy, unspecified, third trimester: Secondary | ICD-10-CM

## 2016-09-28 NOTE — Patient Instructions (Signed)
  Place 32-42 weeks prenatal visit patient instructions here.  

## 2016-09-28 NOTE — Progress Notes (Signed)
ROB- pt is having contractions, a lot of pelvic pressure

## 2016-09-28 NOTE — Progress Notes (Signed)
ROB- Sunday and Monday started having consistent contractions, took turbutaline several times with no relief. Has increased fluids and resting with no relief. Denies any vaginal bleeding but has noticed more vaginal discharge since Sunday. Contractions 15min apart lasting about 1 minute each and they make her stop what she is doing due to discomfort. Patient has also felt like baby is definitely lower.

## 2016-09-29 ENCOUNTER — Encounter: Payer: Self-pay | Admitting: Obstetrics and Gynecology

## 2016-09-29 NOTE — Addendum Note (Signed)
Addended by: Rosine BeatLONTZ, Nazir Hacker L on: 09/29/2016 08:14 AM   Modules accepted: Orders

## 2016-09-30 LAB — GC/CHLAMYDIA PROBE AMP
Chlamydia trachomatis, NAA: NEGATIVE
Neisseria gonorrhoeae by PCR: NEGATIVE

## 2016-10-01 ENCOUNTER — Other Ambulatory Visit: Payer: Self-pay | Admitting: Obstetrics and Gynecology

## 2016-10-01 DIAGNOSIS — O99820 Streptococcus B carrier state complicating pregnancy: Secondary | ICD-10-CM

## 2016-10-01 LAB — STREP GP B NAA: STREP GROUP B AG: POSITIVE — AB

## 2016-10-04 NOTE — Telephone Encounter (Signed)
error 

## 2016-10-08 ENCOUNTER — Ambulatory Visit (INDEPENDENT_AMBULATORY_CARE_PROVIDER_SITE_OTHER): Payer: Self-pay | Admitting: Obstetrics and Gynecology

## 2016-10-08 VITALS — BP 121/75 | HR 107 | Wt 172.9 lb

## 2016-10-08 DIAGNOSIS — Z3493 Encounter for supervision of normal pregnancy, unspecified, third trimester: Secondary | ICD-10-CM

## 2016-10-08 LAB — POCT URINALYSIS DIPSTICK
Bilirubin, UA: NEGATIVE
Blood, UA: NEGATIVE
Glucose, UA: NEGATIVE
KETONES UA: NEGATIVE
LEUKOCYTES UA: NEGATIVE
NITRITE UA: NEGATIVE
PH UA: 6
PROTEIN UA: NEGATIVE
Spec Grav, UA: 1.01
UROBILINOGEN UA: 0.2

## 2016-10-08 NOTE — Progress Notes (Signed)
ROB- some pelvic pressure, she is doing well

## 2016-10-08 NOTE — Progress Notes (Signed)
ROB- doing well, labor precautions discussed and reviewed GBS+

## 2016-10-12 ENCOUNTER — Ambulatory Visit (INDEPENDENT_AMBULATORY_CARE_PROVIDER_SITE_OTHER): Payer: Self-pay | Admitting: Obstetrics and Gynecology

## 2016-10-12 VITALS — BP 108/78 | HR 78 | Wt 170.4 lb

## 2016-10-12 DIAGNOSIS — Z3493 Encounter for supervision of normal pregnancy, unspecified, third trimester: Secondary | ICD-10-CM

## 2016-10-12 LAB — POCT URINALYSIS DIPSTICK
Bilirubin, UA: NEGATIVE
Blood, UA: NEGATIVE
Glucose, UA: NEGATIVE
Ketones, UA: NEGATIVE
LEUKOCYTES UA: NEGATIVE
NITRITE UA: NEGATIVE
PH UA: 6
PROTEIN UA: NEGATIVE
Spec Grav, UA: <=1.005
UROBILINOGEN UA: 0.2

## 2016-10-12 NOTE — Progress Notes (Signed)
ROB- pt is having some contractions, lots of pelvic pressure 

## 2016-10-12 NOTE — Progress Notes (Signed)
ROB- reassured and labor discussed.

## 2016-10-15 ENCOUNTER — Encounter: Payer: Self-pay | Admitting: Obstetrics and Gynecology

## 2016-10-17 ENCOUNTER — Observation Stay
Admission: EM | Admit: 2016-10-17 | Discharge: 2016-10-17 | Disposition: A | Discharge status: Home / Self Care | Payer: Self-pay | Attending: Obstetrics and Gynecology | Admitting: Obstetrics and Gynecology

## 2016-10-17 DIAGNOSIS — Z283 Underimmunization status: Secondary | ICD-10-CM

## 2016-10-17 DIAGNOSIS — Z3A39 39 weeks gestation of pregnancy: Secondary | ICD-10-CM | POA: Insufficient documentation

## 2016-10-17 DIAGNOSIS — Z2839 Other underimmunization status: Secondary | ICD-10-CM

## 2016-10-17 DIAGNOSIS — O479 False labor, unspecified: Secondary | ICD-10-CM | POA: Diagnosis present

## 2016-10-17 DIAGNOSIS — O471 False labor at or after 37 completed weeks of gestation: Principal | ICD-10-CM | POA: Insufficient documentation

## 2016-10-17 DIAGNOSIS — O9989 Other specified diseases and conditions complicating pregnancy, childbirth and the puerperium: Secondary | ICD-10-CM

## 2016-10-17 NOTE — Progress Notes (Signed)
Off monitor - up to dress for discharge.  

## 2016-10-17 NOTE — Progress Notes (Signed)
Discharge instructions given and explained.  Verbalized understanding.  Questions answered.  

## 2016-10-17 NOTE — OB Triage Note (Signed)
Recvd to OBS2 with c/o contractions and decreased fetal movement.  Changed to gown and to bed.  EFM applied. Oriented to room and plan of care discussed. Verbalized understanding and agrees with care.

## 2016-10-20 ENCOUNTER — Ambulatory Visit (INDEPENDENT_AMBULATORY_CARE_PROVIDER_SITE_OTHER): Payer: Self-pay | Admitting: Obstetrics and Gynecology

## 2016-10-20 VITALS — BP 111/84 | HR 112 | Wt 175.2 lb

## 2016-10-20 DIAGNOSIS — Z3493 Encounter for supervision of normal pregnancy, unspecified, third trimester: Secondary | ICD-10-CM

## 2016-10-20 LAB — POCT URINALYSIS DIPSTICK
BILIRUBIN UA: NEGATIVE
GLUCOSE UA: NEGATIVE
KETONES UA: NEGATIVE
Leukocytes, UA: NEGATIVE
Nitrite, UA: NEGATIVE
RBC UA: NEGATIVE
SPEC GRAV UA: 1.01
UROBILINOGEN UA: 0.2
pH, UA: 7

## 2016-10-20 NOTE — Progress Notes (Signed)
ROB- pt is still having pelvic pressure, low back pain

## 2016-10-20 NOTE — Progress Notes (Signed)
Rob- LABOR PRECAUTIONS DISCUSSED,

## 2016-10-22 ENCOUNTER — Encounter: Payer: Self-pay | Admitting: Obstetrics and Gynecology

## 2016-10-24 ENCOUNTER — Inpatient Hospital Stay
Admission: EM | Admit: 2016-10-24 | Discharge: 2016-10-24 | Disposition: A | Discharge status: Home / Self Care | Payer: Self-pay | Attending: Obstetrics and Gynecology | Admitting: Obstetrics and Gynecology

## 2016-10-24 DIAGNOSIS — Z283 Underimmunization status: Secondary | ICD-10-CM

## 2016-10-24 DIAGNOSIS — Z3A38 38 weeks gestation of pregnancy: Secondary | ICD-10-CM | POA: Insufficient documentation

## 2016-10-24 DIAGNOSIS — Z2839 Other underimmunization status: Secondary | ICD-10-CM

## 2016-10-24 DIAGNOSIS — O9989 Other specified diseases and conditions complicating pregnancy, childbirth and the puerperium: Secondary | ICD-10-CM

## 2016-10-24 NOTE — OB Triage Note (Signed)
Ms. Julia Chandler here with increasing c/o ctx since 1530, LOF

## 2016-10-24 NOTE — OB Triage Provider Note (Signed)
L&D OB Triage Note  Tia MaskerWhitney Chandler is a 30 y.o. 583P1011 female at 3240w4d, EDD Estimated Date of Delivery: 11/03/16 who presented to triage for complaints of regular contractions since 1730 and possible LOF.  She was evaluated by the nurses with no significant findings/findings significant for labor or SROM. Vital signs stable. An NST was performed and has been reviewed by Me. She was reassured..   NST INTERPRETATION: Indications: rule out uterine contractions  Mode: External Baseline Rate (A): 135 bpm Variability: Moderate Accelerations: 15 x 15 Decelerations: None     Contraction Frequency (min): occasional  Impression: reactive   Plan: NST performed was reviewed and was found to be reactive. She was discharged home with bleeding/labor precautions.  Continue routine prenatal care. Follow up with OB/GYN as previously scheduled.     Melody Suzan NailerN Shambley, CNM

## 2016-10-24 NOTE — Discharge Summary (Signed)
Patient discharged with instructions on labor precautions, follow up appointment, and when to seek medical attention. Patient ambulatory at discharge with steady gait and patient discharged with significant other.

## 2016-10-24 NOTE — Progress Notes (Signed)
Julia LemmingsWhitney Joanne Chandler is a 30 y.o. G3P1011 at 2836w4d by LMP in observation to r/o labor  Subjective: Reports worse pain with walking, and that contractions space out when lays down  Objective: BP 115/78 (BP Location: Left Arm)   Pulse 92   Temp 98.1 F (36.7 C) (Oral)   Resp 16   Ht 5\' 6"  (1.676 m)   Wt 175 lb (79.4 kg)   LMP 01/20/2016   BMI 28.25 kg/m  No intake/output data recorded. No intake/output data recorded.  FHT:  FHR: 138 bpm, variability: moderate,  accelerations:  Present,  decelerations:  Absent UC:   irregular, every 3-5 minutes, when walking, tearful with contractions SVE:   Dilation: 4 Effacement (%): 80 Station: -2 Exam by:: De BlanchA. Evans, RN  Labs: Lab Results  Component Value Date   WBC 8.3 03/18/2016   HGB 10.3 (L) 02/09/2014   HCT 41.6 03/18/2016   MCV 90 03/18/2016   PLT 389 (H) 03/18/2016    Assessment / Plan: early labor  Labor: will observe Preeclampsia:  NA Fetal Wellbeing:  Category I Pain Control:  Labor support without medications I/D:  n/a Anticipated MOD:  NSVD  Julia Chandler 10/24/2016, 8:23 PM

## 2016-10-25 NOTE — L&D Delivery Note (Signed)
Delivery Note At  2251 a viable and healthy Female infant "Julia Chandler" was delivered via  (Presentation:ROA ;  ).  APGAR:8 ,9 ; weight  .   Placenta status: , .  Cord:  with the following complications: light MSAF and tight [place] x1- infant delivered through  Anesthesia:  epidural Episiotomy:  none Lacerations:  1st degree Suture Repair: 3.0 vicryl rapide Est. Blood Loss (mL):  300  Mom to postpartum.  Baby to Couplet care / Skin to Skin.  Darsi Tien N Ermias Tomeo 11/03/2016, 11:04 PM

## 2016-10-27 NOTE — OB Triage Provider Note (Signed)
L&D OB Triage Note  Julia Chandler is a 31 y.o. 623P1011 female at 4252w0d, EDD Estimated Date of Delivery: 11/03/16 who presented to triage for complaints of irregular contractions and vaginal pressure.  She was evaluated by the nurses with no significant findings/findings significant for labor. Vital signs stable. An NST was performed and has been reviewed by Me. She was treated with po hydration and ambulated for a few hours with no cervical change.   NST INTERPRETATION: Indications: rule out uterine contractions  Mode: External Baseline Rate (A): 125 bpm Variability: Moderate Accelerations: 15 x 15 Decelerations: None     Contraction Frequency (min): 0  Impression: reactive   Plan: NST performed was reviewed and was found to be reactive. She was discharged home with bleeding/labor precautions.  Continue routine prenatal care. Follow up with OB/GYN as previously scheduled.     Berdena Cisek Suzan NailerN Deziree Mokry, CNM

## 2016-10-29 ENCOUNTER — Ambulatory Visit (INDEPENDENT_AMBULATORY_CARE_PROVIDER_SITE_OTHER): Payer: Self-pay | Admitting: Obstetrics and Gynecology

## 2016-10-29 ENCOUNTER — Encounter: Payer: Self-pay | Admitting: Obstetrics and Gynecology

## 2016-10-29 VITALS — BP 91/61 | HR 96 | Wt 177.3 lb

## 2016-10-29 DIAGNOSIS — Z3483 Encounter for supervision of other normal pregnancy, third trimester: Secondary | ICD-10-CM

## 2016-10-29 LAB — POCT URINALYSIS DIPSTICK
Bilirubin, UA: NEGATIVE
Glucose, UA: NEGATIVE
Ketones, UA: NEGATIVE
Leukocytes, UA: NEGATIVE
Nitrite, UA: NEGATIVE
PH UA: 6
PROTEIN UA: NEGATIVE
RBC UA: NEGATIVE
Spec Grav, UA: 1.015
UROBILINOGEN UA: NEGATIVE

## 2016-10-29 NOTE — Progress Notes (Signed)
Rob- NO complaints. Not as much movement.

## 2016-10-29 NOTE — Progress Notes (Signed)
ROB- doing about the same, irregular contractions, discussed postdates care

## 2016-11-03 ENCOUNTER — Other Ambulatory Visit: Payer: Self-pay

## 2016-11-03 ENCOUNTER — Encounter: Payer: Self-pay | Admitting: *Deleted

## 2016-11-03 ENCOUNTER — Ambulatory Visit (INDEPENDENT_AMBULATORY_CARE_PROVIDER_SITE_OTHER): Payer: Self-pay

## 2016-11-03 ENCOUNTER — Inpatient Hospital Stay: Payer: PRIVATE HEALTH INSURANCE | Admitting: Anesthesiology

## 2016-11-03 ENCOUNTER — Encounter: Payer: Self-pay | Admitting: Obstetrics and Gynecology

## 2016-11-03 ENCOUNTER — Inpatient Hospital Stay
Admission: EM | Admit: 2016-11-03 | Discharge: 2016-11-05 | DRG: 775 | Disposition: A | Discharge status: Home / Self Care | Payer: PRIVATE HEALTH INSURANCE | Attending: Obstetrics and Gynecology | Admitting: Obstetrics and Gynecology

## 2016-11-03 ENCOUNTER — Ambulatory Visit (INDEPENDENT_AMBULATORY_CARE_PROVIDER_SITE_OTHER): Payer: Self-pay | Admitting: Obstetrics and Gynecology

## 2016-11-03 VITALS — BP 107/75 | HR 98 | Wt 177.6 lb

## 2016-11-03 DIAGNOSIS — O9989 Other specified diseases and conditions complicating pregnancy, childbirth and the puerperium: Secondary | ICD-10-CM

## 2016-11-03 DIAGNOSIS — Z23 Encounter for immunization: Secondary | ICD-10-CM

## 2016-11-03 DIAGNOSIS — Z8249 Family history of ischemic heart disease and other diseases of the circulatory system: Secondary | ICD-10-CM

## 2016-11-03 DIAGNOSIS — Z283 Underimmunization status: Secondary | ICD-10-CM

## 2016-11-03 DIAGNOSIS — Z2839 Other underimmunization status: Secondary | ICD-10-CM

## 2016-11-03 DIAGNOSIS — Z3A40 40 weeks gestation of pregnancy: Secondary | ICD-10-CM

## 2016-11-03 DIAGNOSIS — O99824 Streptococcus B carrier state complicating childbirth: Secondary | ICD-10-CM | POA: Diagnosis present

## 2016-11-03 DIAGNOSIS — Z3483 Encounter for supervision of other normal pregnancy, third trimester: Secondary | ICD-10-CM

## 2016-11-03 DIAGNOSIS — Z3493 Encounter for supervision of normal pregnancy, unspecified, third trimester: Secondary | ICD-10-CM

## 2016-11-03 LAB — TYPE AND SCREEN
ABO/RH(D): A POS
Antibody Screen: NEGATIVE

## 2016-11-03 LAB — POCT URINALYSIS DIPSTICK
Bilirubin, UA: NEGATIVE
Blood, UA: NEGATIVE
Glucose, UA: NEGATIVE
Ketones, UA: NEGATIVE
LEUKOCYTES UA: NEGATIVE
NITRITE UA: NEGATIVE
PROTEIN UA: NEGATIVE
SPEC GRAV UA: 1.01
UROBILINOGEN UA: 0.2
pH, UA: 6

## 2016-11-03 LAB — CBC
HCT: 32.9 % — ABNORMAL LOW (ref 35.0–47.0)
HEMOGLOBIN: 11.1 g/dL — AB (ref 12.0–16.0)
MCH: 26.7 pg (ref 26.0–34.0)
MCHC: 33.6 g/dL (ref 32.0–36.0)
MCV: 79.4 fL — ABNORMAL LOW (ref 80.0–100.0)
Platelets: 373 10*3/uL (ref 150–440)
RBC: 4.14 MIL/uL (ref 3.80–5.20)
RDW: 15.3 % — AB (ref 11.5–14.5)
WBC: 10.5 10*3/uL (ref 3.6–11.0)

## 2016-11-03 MED ORDER — EPHEDRINE SULFATE 50 MG/ML IJ SOLN
INTRAMUSCULAR | Status: DC | PRN
  Administered 2016-11-03: 10 mg via INTRAVENOUS

## 2016-11-03 MED ORDER — NALOXONE HCL 0.4 MG/ML IJ SOLN: 0.4000 mg | INTRAMUSCULAR | Status: DC | PRN

## 2016-11-03 MED ORDER — TERBUTALINE SULFATE 1 MG/ML IJ SOLN: 0.2500 mg | Freq: Once | INTRAMUSCULAR | Status: DC | PRN

## 2016-11-03 MED ORDER — FENTANYL 2.5 MCG/ML W/ROPIVACAINE 0.2% IN NS 100 ML EPIDURAL INFUSION (ARMC-ANES)
EPIDURAL | Status: AC
  Filled 2016-11-03: qty 100

## 2016-11-03 MED ORDER — OXYTOCIN 40 UNITS IN LACTATED RINGERS INFUSION - SIMPLE MED
2.5000 [IU]/h | INTRAVENOUS | Status: DC
  Administered 2016-11-03: 2.5 [IU]/h via INTRAVENOUS
  Filled 2016-11-03: qty 1000

## 2016-11-03 MED ORDER — PHENYLEPHRINE HCL 10 MG/ML IJ SOLN
INTRAMUSCULAR | Status: DC | PRN
  Administered 2016-11-03: 80 ug via INTRAVENOUS

## 2016-11-03 MED ORDER — DIPHENHYDRAMINE HCL 25 MG PO CAPS: 25.0000 mg | ORAL_CAPSULE | ORAL | Status: DC | PRN

## 2016-11-03 MED ORDER — SODIUM CHLORIDE 0.9 % IV SOLN
1.0000 g | INTRAVENOUS | Status: DC
  Administered 2016-11-03 (×2): 1 g via INTRAVENOUS
  Filled 2016-11-03 (×2): qty 1000

## 2016-11-03 MED ORDER — LACTATED RINGERS IV SOLN
INTRAVENOUS | Status: DC
  Administered 2016-11-03: 22:00:00 via INTRAVENOUS
  Administered 2016-11-03: 125 mL/h via INTRAVENOUS

## 2016-11-03 MED ORDER — LIDOCAINE HCL (PF) 1 % IJ SOLN: 30.0000 mL | INTRAMUSCULAR | Status: DC | PRN

## 2016-11-03 MED ORDER — OXYTOCIN 40 UNITS IN LACTATED RINGERS INFUSION - SIMPLE MED: 1.0000 m[IU]/min | INTRAVENOUS | Status: DC

## 2016-11-03 MED ORDER — ONDANSETRON HCL 4 MG/2ML IJ SOLN
4.0000 mg | Freq: Four times a day (QID) | INTRAMUSCULAR | Status: DC | PRN
Start: 2016-11-03 — End: 2016-11-04

## 2016-11-03 MED ORDER — SODIUM CHLORIDE 0.9 % IV SOLN
INTRAVENOUS | Status: DC | PRN
  Administered 2016-11-03 (×2): 5 mL via EPIDURAL

## 2016-11-03 MED ORDER — NALOXONE HCL 2 MG/2ML IJ SOSY
1.0000 ug/kg/h | PREFILLED_SYRINGE | INTRAVENOUS | Status: DC | PRN
  Filled 2016-11-03: qty 2

## 2016-11-03 MED ORDER — DIPHENHYDRAMINE HCL 50 MG/ML IJ SOLN: 12.5000 mg | INTRAMUSCULAR | Status: DC | PRN

## 2016-11-03 MED ORDER — LIDOCAINE HCL (PF) 1 % IJ SOLN
INTRAMUSCULAR | Status: DC | PRN
  Administered 2016-11-03: 1 mL via INTRADERMAL

## 2016-11-03 MED ORDER — AMMONIA AROMATIC IN INHA
RESPIRATORY_TRACT | Status: AC
  Filled 2016-11-03: qty 10

## 2016-11-03 MED ORDER — LIDOCAINE HCL (PF) 1 % IJ SOLN
INTRAMUSCULAR | Status: AC
  Filled 2016-11-03: qty 30

## 2016-11-03 MED ORDER — NALBUPHINE HCL 10 MG/ML IJ SOLN: 5.0000 mg | Freq: Once | INTRAMUSCULAR | Status: DC | PRN

## 2016-11-03 MED ORDER — FENTANYL CITRATE (PF) 100 MCG/2ML IJ SOLN: 50.0000 ug | INTRAMUSCULAR | Status: DC | PRN

## 2016-11-03 MED ORDER — LIDOCAINE-EPINEPHRINE (PF) 1.5 %-1:200000 IJ SOLN
INTRAMUSCULAR | Status: DC | PRN
  Administered 2016-11-03: 3 mL via EPIDURAL

## 2016-11-03 MED ORDER — SODIUM CHLORIDE 0.9% FLUSH: 3.0000 mL | INTRAVENOUS | Status: DC | PRN

## 2016-11-03 MED ORDER — OXYTOCIN BOLUS FROM INFUSION
500.0000 mL | Freq: Once | INTRAVENOUS | Status: AC
  Administered 2016-11-03: 500 mL via INTRAVENOUS

## 2016-11-03 MED ORDER — FENTANYL 2.5 MCG/ML W/ROPIVACAINE 0.2% IN NS 100 ML EPIDURAL INFUSION (ARMC-ANES): 10.0000 mL/h | EPIDURAL | Status: DC

## 2016-11-03 MED ORDER — FENTANYL 2.5 MCG/ML W/ROPIVACAINE 0.2% IN NS 100 ML EPIDURAL INFUSION (ARMC-ANES)
EPIDURAL | Status: DC | PRN
  Administered 2016-11-03: 10 mL/h via EPIDURAL

## 2016-11-03 MED ORDER — NALBUPHINE HCL 10 MG/ML IJ SOLN: 5.0000 mg | INTRAMUSCULAR | Status: DC | PRN

## 2016-11-03 MED ORDER — LACTATED RINGERS IV SOLN
500.0000 mL | INTRAVENOUS | Status: DC | PRN
  Administered 2016-11-03 (×2): 500 mL via INTRAVENOUS

## 2016-11-03 MED ORDER — MISOPROSTOL 200 MCG PO TABS
ORAL_TABLET | ORAL | Status: AC
  Filled 2016-11-03: qty 4

## 2016-11-03 MED ORDER — AMPICILLIN SODIUM 2 G IJ SOLR
2.0000 g | Freq: Once | INTRAMUSCULAR | Status: AC
  Administered 2016-11-03: 2 g via INTRAVENOUS
  Filled 2016-11-03: qty 2000

## 2016-11-03 MED ORDER — OXYTOCIN 10 UNIT/ML IJ SOLN
INTRAMUSCULAR | Status: AC
  Filled 2016-11-03: qty 2

## 2016-11-03 MED ORDER — SOD CITRATE-CITRIC ACID 500-334 MG/5ML PO SOLN
30.0000 mL | ORAL | Status: DC | PRN
Start: 2016-11-03 — End: 2016-11-04

## 2016-11-03 NOTE — Progress Notes (Signed)
ROB and growth scan: to L&D to deliver   Indications: Growth and AFI Findings:  Singleton intrauterine pregnancy is visualized with FHR at 150 BPM. Biometrics give an (U/S) Gestational age of [redacted] weeks and an (U/S) EDD of 11/10/16; this correlates with the clinically established EDD of 11/03/16.  Fetal presentation is vertex, spine left lateral.  EFW: 3584 grams ( 7 lbs. 14 oz. ) 57th percentile, Williams Placenta: Right lateral, grade 3 with calcifications noted. AFI: Adequate at 11.3 cm.  Anatomic survey of the fetal stomach, bladder and kidneys appears WNL. Gender - Female.    Impression: 1. 39 week Viable Singleton Intrauterine pregnancy by U/S. 2. (U/S) EDD is consistent with Clinically established (LMP) EDD of 11/03/16. 3. EFW: 3584 grams ( 7 lbs. 14 oz. ) 57th percentile, Williams 4. AFI: 11.3 cm, adequate for 40 weeks.

## 2016-11-03 NOTE — Anesthesia Procedure Notes (Signed)
Epidural Patient location during procedure: OB Start time: 11/03/2016 8:20 PM End time: 11/03/2016 8:25 PM  Staffing Anesthesiologist: Margorie JohnPISCITELLO, Jakaden Ouzts K Performed: anesthesiologist   Preanesthetic Checklist Completed: patient identified, site marked, surgical consent, pre-op evaluation, timeout performed, IV checked, risks and benefits discussed and monitors and equipment checked  Epidural Patient position: sitting Prep: Betadine Patient monitoring: heart rate, continuous pulse ox and blood pressure Approach: midline Location: L3-L4 Injection technique: LOR saline  Needle:  Needle type: Tuohy  Needle gauge: 17 G Needle length: 9 cm and 9 Needle insertion depth: 6 cm Catheter type: closed end flexible Catheter size: 19 Gauge Catheter at skin depth: 11 cm Test dose: negative and 1.5% lidocaine with Epi 1:200 K  Assessment Sensory level: T10 Events: blood not aspirated, injection not painful, no injection resistance, negative IV test and no paresthesia  Additional Notes Pt. Evaluated and documentation done after procedure finished. Right sided paresthesia that resolved with needle retraction and redirection . Patient identified. Risks/Benefits/Options discussed with patient including but not limited to bleeding, infection, nerve damage, paralysis, failed block, incomplete pain control, headache, blood pressure changes, nausea, vomiting, reactions to medication both or allergic, itching and postpartum back pain. Confirmed with bedside nurse the patient's most recent platelet count. Confirmed with patient that they are not currently taking any anticoagulation, have any bleeding history or any family history of bleeding disorders. Patient expressed understanding and wished to proceed. All questions were answered. Sterile technique was used throughout the entire procedure. Please see nursing notes for vital signs. Test dose was given through epidural catheter and negative prior to  continuing to dose epidural or start infusion. Warning signs of high block given to the patient including shortness of breath, tingling/numbness in hands, complete motor block, or any concerning symptoms with instructions to call for help. Patient was given instructions on fall risk and not to get out of bed. All questions and concerns addressed with instructions to call with any issues or inadequate analgesia.   Patient tolerated the insertion well without immediate complications.Reason for block:procedure for pain

## 2016-11-03 NOTE — Anesthesia Preprocedure Evaluation (Signed)
Anesthesia Evaluation  Patient identified by MRN, date of birth, ID band Patient awake    Reviewed: Allergy & Precautions, H&P , NPO status , Patient's Chart, lab work & pertinent test results  History of Anesthesia Complications Negative for: history of anesthetic complications  Airway Mallampati: III  TM Distance: >3 FB Neck ROM: full    Dental  (+) Poor Dentition   Pulmonary neg pulmonary ROS,    Pulmonary exam normal breath sounds clear to auscultation       Cardiovascular Exercise Tolerance: Good (-) hypertensionnegative cardio ROS Normal cardiovascular exam Rhythm:regular Rate:Normal     Neuro/Psych    GI/Hepatic negative GI ROS,   Endo/Other    Renal/GU   negative genitourinary   Musculoskeletal   Abdominal   Peds  Hematology negative hematology ROS (+)   Anesthesia Other Findings Patient reported no problems with narcotics in previous epidural  Past Medical History: No date: Headache(784.0) No date: Ovarian cyst No date: Preterm labor No date: Vaginal Pap smear, abnormal     Comment: f/u ok  Past Surgical History: No date: WISDOM TOOTH EXTRACTION  BMI    Body Mass Index:  28.57 kg/m      Reproductive/Obstetrics (+) Pregnancy                             Anesthesia Physical Anesthesia Plan  ASA: II  Anesthesia Plan: Epidural   Post-op Pain Management:    Induction:   Airway Management Planned:   Additional Equipment:   Intra-op Plan:   Post-operative Plan:   Informed Consent: I have reviewed the patients History and Physical, chart, labs and discussed the procedure including the risks, benefits and alternatives for the proposed anesthesia with the patient or authorized representative who has indicated his/her understanding and acceptance.     Plan Discussed with: Anesthesiologist  Anesthesia Plan Comments:         Anesthesia Quick  Evaluation

## 2016-11-03 NOTE — Progress Notes (Signed)
Julia Chandler is a 31 y.o. G3P1011 at 4858w0d by LMP admitted for induction of labor due to Elective at term.  Subjective: Denies pain  Objective: BP 108/77 (BP Location: Right Arm)   Pulse 95   Temp 97.1 F (36.2 C) (Oral)   Resp 17   Ht 5\' 6"  (1.676 m)   Wt 177 lb (80.3 kg)   LMP 01/20/2016   BMI 28.57 kg/m  No intake/output data recorded. Total I/O In: 550 [I.V.:500; IV Piggyback:50] Out: -   FHT:  FHR: 135 bpm, variability: moderate,  accelerations:  Present,  decelerations:  Absent UC:   irregular, every 10-30 minutes SVE:   Dilation: 5.5 Effacement (%): 80 Station: -1 Exam by:: Millner, RN   Labs: Lab Results  Component Value Date   WBC 10.5 11/03/2016   HGB 11.1 (L) 11/03/2016   HCT 32.9 (L) 11/03/2016   MCV 79.4 (L) 11/03/2016   PLT 373 11/03/2016    Assessment / Plan: Induction of labor due to term with favorable cervix,  progressing well on pitocin  Labor: Progressing normally Preeclampsia:  labs stable Fetal Wellbeing:  Category I Pain Control:  Labor support without medications I/D:  n/a Anticipated MOD:  NSVD  Julia Chandler 11/03/2016, 5:42 PM

## 2016-11-03 NOTE — H&P (Signed)
Obstetric History and Physical  Julia Chandler is a 31 y.o. G3P1011 with IUP at [redacted]w[redacted]d presenting with irregular contractions and advanced cervical dilation. Patient states she has been having  irregular, every 10-15 minutes contractions, none vaginal bleeding, intact membranes, with active fetal movement.    Prenatal Course Source of Care: [facility]  Pregnancy complications or risks:PTL at 30 weeks  Prenatal labs and studies: ABO, Rh: --/--/A POS (01/10 1201) Antibody: NEG (01/10 1201) Rubella: <0.90 (05/25 1055) RPR: Non Reactive (05/25 1055)  HBsAg: Negative (05/25 1055)  HIV: Non Reactive (05/25 1055)  ZOX:WRUEAVWU (12/05 0400) 1 hr Glucola  normal Genetic screening normal Anatomy US normal  Past Medical History:  Diagnosis Date  . Headache(784.0)   . Ovarian cyst   . Preterm labor   . Vaginal Pap smear, abnormal    f/u ok    Past Surgical History:  Procedure Laterality Date  . WISDOM TOOTH EXTRACTION      OB History  Gravida Para Term Preterm AB Living  3 1 1   1 1   SAB TAB Ectopic Multiple Live Births  1       1    # Outcome Date GA Lbr Len/2nd Weight Sex Delivery Anes PTL Lv  3 Current           2 Term 02/08/14 [redacted]w[redacted]d 12:30 / 02:34 6 lb 13.7 oz (3.11 kg) F Vag-Vacuum EPI  LIV  1 SAB 08/11/11              Social History   Social History  . Marital status: Married    Spouse name: N/A  . Number of children: N/A  . Years of education: N/A   Social History Main Topics  . Smoking status: Never Smoker  . Smokeless tobacco: Never Used  . Alcohol use No  . Drug use: No  . Sexual activity: Not Currently    Birth control/ protection: None     Comment: Last sexual activity around 8 weeks ago   Other Topics Concern  . Not on file   Social History Narrative  . No narrative on file    Family History  Problem Relation Age of Onset  . Cancer Mother   . Miscarriages / [place] Mother   . Hypertension Father   . Anxiety disorder Sister   . Thyroid disease  Maternal Grandmother   . Cancer Maternal Grandfather   . Heart disease Paternal Grandmother   . Hypertension Paternal Grandmother   . Heart disease Paternal Grandfather   . Hypertension Paternal Grandfather     Prescriptions Prior to Admission  Medication Sig Dispense Refill Last Dose  . FIBER COMPLETE PO Take by mouth.   Past Week  . Prenatal MV-Min-Fe Fum-FA-DHA (PRENATAL 1 PO) Take by mouth.   11/02/2016    Allergies  Allergen Reactions  . Vicodin [Hydrocodone-Acetaminophen] Rash  . Percocet [Oxycodone-Acetaminophen] Rash    Review of Systems: Negative except for what is mentioned in HPI.  Physical Exam: BP 95/71 (BP Location: Right Arm)   Pulse 84   Temp 98.1 F (36.7 C) (Oral)   Resp 17   Ht 5\' 6"  (1.676 m)   Wt 177 lb (80.3 kg)   LMP 01/20/2016   BMI 28.57 kg/m  GENERAL: Well-developed, well-nourished female in no acute distress.  LUNGS: Clear to auscultation bilaterally.  HEART: Regular rate and rhythm. ABDOMEN: Soft, nontender, nondistended, gravid. EXTREMITIES: Nontender, no edema, 2+ distal pulses. Cervical Exam:   FHT:  Baseline rate 148 bpm  Variability moderate  Accelerations present   Decelerations none Contractions: Every 7-9 mins   Pertinent Labs/Studies:   Results for orders placed or performed during the hospital encounter of 11/03/16 (from the past 24 hour(s))  CBC     Status: Abnormal   Collection Time: 11/03/16 12:01 PM  Result Value Ref Range   WBC 10.5 3.6 - 11.0 K/uL   RBC 4.14 3.80 - 5.20 MIL/uL   Hemoglobin 11.1 (L) 12.0 - 16.0 g/dL   HCT 16.132.9 (L) 09.635.0 - 04.547.0 %   MCV 79.4 (L) 80.0 - 100.0 fL   MCH 26.7 26.0 - 34.0 pg   MCHC 33.6 32.0 - 36.0 g/dL   RDW 40.915.3 (H) 81.111.5 - 91.414.5 %   Platelets 373 150 - 440 K/uL  Type and screen     Status: None   Collection Time: 11/03/16 12:01 PM  Result Value Ref Range   ABO/RH(D) A POS    Antibody Screen NEG    Sample Expiration 11/06/2016     Assessment : Julia MaskerWhitney Chandler is a 31 y.o. G3P1011 at  8263w0d being admitted for labor.  Plan: Labor: Expectant management.  Induction/Augmentation as needed, per protocol FWB: Reassuring fetal heart tracing.  GBS positive Delivery plan: Hopeful for vaginal delivery  Mckinze Poirier, CNM [facility]

## 2016-11-03 NOTE — Progress Notes (Signed)
Patient requests epidural:19:58  MD: Piscitello  MD notified:  20:02  MD in room:  20:12  1% local:  20:20  Test dose given by MD:  20:25  Maternal heart rate: 96  Bolus 1 given by MD:  20:30  Bolus 2 given by MD:  20:32  Drip started:  20:33

## 2016-11-03 NOTE — Progress Notes (Signed)
Julia Chandler is a 31 y.o. G3P1011 at 7339w0d by LMP admitted for induction of labor due to Elective at term.  Subjective: Reports increasing intensity and frequency of contractions, rates a 4 on 0-10 pain scale  Objective: BP 108/77 (BP Location: Right Arm)   Pulse 95   Temp 97.1 F (36.2 C) (Oral)   Resp 17   Ht 5\' 6"  (1.676 m)   Wt 177 lb (80.3 kg)   LMP 01/20/2016   BMI 28.57 kg/m  No intake/output data recorded. Total I/O In: 550 [I.V.:500; IV Piggyback:50] Out: -   FHT:  FHR: 150 bpm, variability: moderate,  accelerations:  Present,  decelerations:  Absent UC:   irregular, every 4-9 minutes SVE:   Dilation: 5.5 Effacement (%): 80 Station: -1 Exam by:: Millner, RN  Light MSAF noted Labs: Lab Results  Component Value Date   WBC 10.5 11/03/2016   HGB 11.1 (L) 11/03/2016   HCT 32.9 (L) 11/03/2016   MCV 79.4 (L) 11/03/2016   PLT 373 11/03/2016    Assessment / Plan: IOL with AROM, progressing normally  Labor: Progressing normally Preeclampsia:  labs stable Fetal Wellbeing:  Category I Pain Control:  Labor support without medications I/D:  n/a Anticipated MOD:  NSVD  Julia Chandler 11/03/2016, 6:41 PM

## 2016-11-03 NOTE — Progress Notes (Signed)
Alphonzo LemmingsWhitney Joanne GavelSutton is a 31 y.o. G3P1011 at 1677w0d by LMP admitted for induction of labor due to Elective at term.  Subjective: Denies any pain since epidural placed.  Objective: BP (!) 94/55   Pulse 86   Temp 97.8 F (36.6 C) (Oral)   Resp 16   Ht 5\' 6"  (1.676 m)   Wt 177 lb (80.3 kg)   LMP 01/20/2016   SpO2 100%   BMI 28.57 kg/m  I/O last 3 completed shifts: In: 550 [I.V.:500; IV Piggyback:50] Out: -  No intake/output data recorded.  FHT:  FHR: 122 bpm, variability: minimal ,  accelerations:  Present,  decelerations:  Present 2 episodes of spontaneous decels lasting 3-6 minutes with slow return to baseline after position changes/O2/IVF bolus. BP was low due to epidural effects at start of each decel. foley placed per RN at second decel UC:   irregular, every 2-4 minutes SVE:   Dilation: 8.5 Effacement (%): 100 Station: 0 [facility]am by:: [facility]  Labs: Lab Results  Component Value Date   WBC 10.5 11/03/2016   HGB 11.1 (L) 11/03/2016   HCT 32.9 (L) 11/03/2016   MCV 79.4 (L) 11/03/2016   PLT 373 11/03/2016    Assessment / Plan: Augmentation of labor, progressing well  Labor: Progressing normally Preeclampsia:  labs stable Fetal Wellbeing:  Category I Pain Control:  Epidural I/D:  n/a Anticipated MOD:  NSVD  Melody N Shambley 11/03/2016, 10:17 PM

## 2016-11-03 NOTE — Progress Notes (Signed)
ROB- pt had growth scan today, pt is having contractions, and lots of pelvic pressure

## 2016-11-04 LAB — RPR: RPR: NONREACTIVE

## 2016-11-04 LAB — CBC
HEMATOCRIT: 28.9 % — AB (ref 35.0–47.0)
HEMOGLOBIN: 9.5 g/dL — AB (ref 12.0–16.0)
MCH: 26.3 pg (ref 26.0–34.0)
MCHC: 32.9 g/dL (ref 32.0–36.0)
MCV: 79.8 fL — ABNORMAL LOW (ref 80.0–100.0)
Platelets: 318 10*3/uL (ref 150–440)
RBC: 3.62 MIL/uL — ABNORMAL LOW (ref 3.80–5.20)
RDW: 15.2 % — ABNORMAL HIGH (ref 11.5–14.5)
WBC: 15.8 10*3/uL — AB (ref 3.6–11.0)

## 2016-11-04 MED ORDER — DIPHENHYDRAMINE HCL 25 MG PO CAPS
25.0000 mg | ORAL_CAPSULE | Freq: Four times a day (QID) | ORAL | Status: DC | PRN
Start: 2016-11-04 — End: 2016-11-05

## 2016-11-04 MED ORDER — SIMETHICONE 80 MG PO CHEW: 80.0000 mg | CHEWABLE_TABLET | ORAL | Status: DC | PRN

## 2016-11-04 MED ORDER — IBUPROFEN 600 MG PO TABS
600.0000 mg | ORAL_TABLET | Freq: Four times a day (QID) | ORAL | Status: DC
  Administered 2016-11-04 – 2016-11-05 (×5): 600 mg via ORAL
  Filled 2016-11-04 (×5): qty 1

## 2016-11-04 MED ORDER — PRENATAL MULTIVITAMIN CH
1.0000 | ORAL_TABLET | Freq: Every day | ORAL | Status: DC
  Administered 2016-11-04: 1 via ORAL
  Filled 2016-11-04: qty 1

## 2016-11-04 MED ORDER — IBUPROFEN 600 MG PO TABS
600.0000 mg | ORAL_TABLET | Freq: Four times a day (QID) | ORAL | Status: DC
  Administered 2016-11-04: 600 mg via ORAL
  Filled 2016-11-04: qty 1

## 2016-11-04 MED ORDER — COCONUT OIL OIL
1.0000 "application " | TOPICAL_OIL | Status: DC | PRN
  Administered 2016-11-04: 1 via TOPICAL
  Filled 2016-11-04: qty 120

## 2016-11-04 MED ORDER — BENZOCAINE-MENTHOL 20-0.5 % EX AERO
1.0000 "application " | INHALATION_SPRAY | CUTANEOUS | Status: DC | PRN
  Filled 2016-11-04: qty 56

## 2016-11-04 MED ORDER — ONDANSETRON HCL 4 MG PO TABS: 4.0000 mg | ORAL_TABLET | ORAL | Status: DC | PRN

## 2016-11-04 MED ORDER — DIBUCAINE 1 % RE OINT: 1.0000 "application " | TOPICAL_OINTMENT | RECTAL | Status: DC | PRN

## 2016-11-04 MED ORDER — ONDANSETRON HCL 4 MG/2ML IJ SOLN: 4.0000 mg | INTRAMUSCULAR | Status: DC | PRN

## 2016-11-04 MED ORDER — WITCH HAZEL-GLYCERIN EX PADS: 1.0000 "application " | MEDICATED_PAD | CUTANEOUS | Status: DC | PRN

## 2016-11-04 MED ORDER — DOCUSATE SODIUM 100 MG PO CAPS
100.0000 mg | ORAL_CAPSULE | Freq: Two times a day (BID) | ORAL | Status: DC
  Administered 2016-11-04 – 2016-11-05 (×4): 100 mg via ORAL
  Filled 2016-11-04 (×4): qty 1

## 2016-11-04 MED ORDER — ACETAMINOPHEN 325 MG PO TABS
650.0000 mg | ORAL_TABLET | Freq: Four times a day (QID) | ORAL | Status: DC | PRN
  Administered 2016-11-04 – 2016-11-05 (×4): 650 mg via ORAL
  Filled 2016-11-04 (×4): qty 2

## 2016-11-04 MED ORDER — MEASLES, MUMPS & RUBELLA VAC ~~LOC~~ INJ: 0.5000 mL | INJECTION | Freq: Once | SUBCUTANEOUS | Status: DC

## 2016-11-04 NOTE — Lactation Note (Signed)
This note was copied from a baby's chart. Lactation Consultation Note  Patient Name: Julia Chandler ZOXWR'UToday's Date: 11/04/2016 Reason for consult: Initial assessment   Maternal Data Formula Feeding for Exclusion: No Has patient been taught Hand Expression?: Yes Does the patient have breastfeeding experience prior to this delivery?: Yes  Feeding Feeding Type: Breast Fed Length of feed: 30 min  LATCH Score/Interventions Latch: Repeated attempts needed to sustain latch, nipple held in mouth throughout feeding, stimulation needed to elicit sucking reflex. Intervention(s): Adjust position;Assist with latch  Audible Swallowing: A few with stimulation Intervention(s): Skin to skin  Type of Nipple: Everted at rest and after stimulation  Comfort (Breast/Nipple): Soft / non-tender     Hold (Positioning): Assistance needed to correctly position infant at breast and maintain latch. Intervention(s): Breastfeeding basics reviewed  LATCH Score: 7  Lactation Tools Discussed/Used     Consult Status Consult Status: Follow-up    Trudee GripCarolyn P Lark Runk 11/04/2016, 1:01 PM

## 2016-11-04 NOTE — Progress Notes (Signed)
Post Partum Day 1 Subjective: no complaints, up ad lib and voiding  Objective: Blood pressure 100/60, pulse 78, temperature 98 F (36.7 C), temperature source Oral, resp. rate 18, height 5\' 6"  (1.676 m), weight 177 lb (80.3 kg), last menstrual period 01/20/2016, SpO2 100 %, unknown if currently breastfeeding.  Physical Exam:  General: alert, cooperative, appears stated age, fatigued and pale Lochia: appropriate Uterine Fundus: firm Incision: NA DVT Evaluation: No evidence of DVT seen on physical exam. Negative Homan's sign.   Recent Labs  11/03/16 1201 11/04/16 0638  HGB 11.1* 9.5*  HCT 32.9* 28.9*    Assessment/Plan: Plan for discharge tomorrow, Breastfeeding and Circumcision prior to discharge   LOS: 1 day   Julia Chandler N Julia Chandler 11/04/2016, 1:30 PM

## 2016-11-04 NOTE — Anesthesia Postprocedure Evaluation (Signed)
Anesthesia Post Note  Patient: Julia Chandler  Procedure(s) Performed: * No procedures listed *  Patient location during evaluation: Mother Baby Anesthesia Type: Epidural Level of consciousness: awake and alert Pain management: pain level controlled Vital Signs Assessment: post-procedure vital signs reviewed and stable Respiratory status: spontaneous breathing, nonlabored ventilation and respiratory function stable Cardiovascular status: stable Postop Assessment: no headache, no backache and epidural receding Anesthetic complications: no     Last Vitals:  Vitals:   11/04/16 0246 11/04/16 0337  BP: 100/62 110/66  Pulse: 86 86  Resp: 14 16  Temp: 36.4 C 36.5 C    Last Pain:  Vitals:   11/04/16 0604  TempSrc:   PainSc: Asleep                 [name],  [name]

## 2016-11-05 MED ORDER — VITAMIN D3 125 MCG (5000 UT) PO CAPS: 1.0000 | ORAL_CAPSULE | Freq: Every day | ORAL | 2 refills | Status: DC

## 2016-11-05 MED ORDER — NORETHINDRONE 0.35 MG PO TABS: 1.0000 | ORAL_TABLET | Freq: Every day | ORAL | 11 refills | Status: DC

## 2016-11-05 MED ORDER — FUSION PLUS PO CAPS: 1.0000 | ORAL_CAPSULE | Freq: Every day | ORAL | 1 refills | Status: DC

## 2016-11-05 NOTE — Progress Notes (Signed)
Patient discharge to home via wheelchair with spouse and baby in car seat. Discharge instructions reviewed with patient.  All questions answered.  

## 2016-11-05 NOTE — Discharge Summary (Signed)
OB Discharge Summary     Patient Name: Julia Chandler DOB: [birth year] MRN: [id number]  Date of admission: 11/03/2016 Delivering MD: Purcell Nails   Date of discharge: 11/05/2016  Admitting diagnosis: 40 wks preg induction Intrauterine pregnancy: [redacted]w[redacted]d     Secondary diagnosis:  Active Problems:   Labor and delivery, indication for care  Additional problems: GBS+     Discharge diagnosis: Term Pregnancy Delivered                                                                                                Post partum procedures:rubella vaccine  Augmentation: AROM  Complications: None  Hospital course:  Onset of Labor With Vaginal Delivery     31 y.o. yo [id number] at [redacted]w[redacted]d was admitted in Latent Labor on 11/03/2016. Patient had an uncomplicated labor course as follows:  Membrane Rupture Time/Date: 5:37 PM ,11/03/2016   Intrapartum Procedures: Episiotomy: None [1]                                         Lacerations:  1st degree [2]  Patient had a delivery of a Viable infant. 11/03/2016  Information for the patient's newborn:  Antionetta, Ator [[id number]]  Delivery Method: Vag-Spont    Pateint had an uncomplicated postpartum course.  She is ambulating, tolerating a regular diet, passing flatus, and urinating well. Patient is discharged home in stable condition on 11/05/16.    Physical exam Vitals:   11/04/16 1119 11/04/16 1618 11/04/16 2034 11/05/16 0814  BP: 100/60 104/68 (!) 89/63 112/70  Pulse: 78 84 87 82  Resp: 18 18 17 18   Temp: 98 F (36.7 C) 97.7 F (36.5 C) 97.9 F (36.6 C) 98 F (36.7 C)  TempSrc: Oral Oral Oral Oral  SpO2:   100% 100%  Weight:      Height:       General: alert, cooperative and no distress Lochia: appropriate Uterine Fundus: firm Incision: N/A DVT Evaluation: No evidence of DVT seen on physical exam. Negative Homan's sign. Calf/Ankle edema is present Labs: Lab Results  Component Value Date   WBC 15.8 (H) 11/04/2016   HGB 9.5  (L) 11/04/2016   HCT 28.9 (L) 11/04/2016   MCV 79.8 (L) 11/04/2016   PLT 318 11/04/2016   CMP Latest Ref Rng & Units 02/08/2014  Glucose 70 - 99 mg/dL 81  BUN 6 - 23 mg/dL 6  Creatinine 1.30 - 8.65 mg/dL 7.84  Sodium 696 - 295 mEq/L 136(L)  Potassium 3.7 - 5.3 mEq/L 4.1  Chloride 96 - 112 mEq/L 101  CO2 19 - 32 mEq/L 18(L)  Calcium 8.4 - 10.5 mg/dL 8.7  Total Protein 6.0 - 8.3 g/dL 5.9(L)  Total Bilirubin 0.3 - 1.2 mg/dL <2.8(U)  Alkaline Phos 39 - 117 U/L 170(H)  AST 0 - 37 U/L 14  ALT 0 - 35 U/L 8    Discharge instruction: per After Visit Summary and "Baby and Me Booklet".  After visit meds:    Diet: routine  diet  Activity: Advance as tolerated. Pelvic rest for 6 weeks.   Outpatient follow up:6 weeks Follow up Appt:Future Appointments Date Time Provider Department Center  12/14/2016 11:00 AM Leonilda Cozby Suzan NailerN Aevah Stansbery, CNM [facility] None   Follow up Visit:No Follow-up on file.  Postpartum contraception: Progesterone only pills  Newborn Data: Live born female "[place]" Birth Weight: 7 lb 6.9 oz (3370 g) APGAR: 8, 9  Baby Feeding: Breast Disposition:home with mother   11/05/2016 Purcell NailsMelody N Aline Wesche, CNM

## 2016-11-19 ENCOUNTER — Encounter: Payer: Self-pay | Admitting: Obstetrics and Gynecology

## 2016-12-14 ENCOUNTER — Encounter: Payer: Self-pay | Admitting: Obstetrics and Gynecology

## 2016-12-14 ENCOUNTER — Ambulatory Visit (INDEPENDENT_AMBULATORY_CARE_PROVIDER_SITE_OTHER): Payer: Self-pay | Admitting: Obstetrics and Gynecology

## 2016-12-14 NOTE — Progress Notes (Signed)
  Subjective:     Julia Chandler is a 31 y.o. female who presents for a postpartum visit. She is 6 weeks postpartum following a spontaneous vaginal delivery. I have fully reviewed the prenatal and intrapartum course. The delivery was at 40 gestational weeks. Outcome: spontaneous vaginal delivery. Anesthesia: epidural. Postpartum course has been uncomplicated. Baby's course has been uncomplicated. Baby is feeding by breast. Bleeding no bleeding. Bowel function is normal. Bladder function is normal. Patient is not sexually active. Contraception method is abstinence and oral progesterone-only contraceptive. Postpartum depression screening: negative.  The following portions of the patient's history were reviewed and updated as appropriate: allergies, current medications, past family history, past medical history, past social history, past surgical history and problem list.  Review of Systems A comprehensive review of systems was negative.   Objective:    BP 109/82   Pulse 83   Ht 5\' 6"  (1.676 m)   Wt 162 lb 11.2 oz (73.8 kg)   Breastfeeding? Yes   BMI 26.26 kg/m   General:  alert, cooperative and appears stated age   Breasts:  negative  Lungs: clear to auscultation bilaterally  Heart:  regular rate and rhythm, S1, S2 normal, no murmur, click, rub or gallop  Abdomen: soft, non-tender; bowel sounds normal; no masses,  no organomegaly   Vulva:  normal  Vagina: normal vagina, no discharge, exudate, lesion, or erythema  Cervix:  multiparous appearance  Corpus: normal size, contour, position, consistency, mobility, non-tender  Adnexa:  normal adnexa and no mass, fullness, tenderness  Rectal Exam: Not performed.        Assessment:     6 weeks postpartum exam. Pap smear not done at today's visit.   Plan:    1. Contraception: oral progesterone-only contraceptive 2. Anemia- labs rechecked 3. Follow up in: 4 months or as needed.

## 2016-12-14 NOTE — Patient Instructions (Signed)
  Place postpartum visit patient instructions here.  

## 2016-12-15 LAB — CBC
HEMATOCRIT: 35.6 % (ref 34.0–46.6)
Hemoglobin: 11.3 g/dL (ref 11.1–15.9)
MCH: 26.3 pg — AB (ref 26.6–33.0)
MCHC: 31.7 g/dL (ref 31.5–35.7)
MCV: 83 fL (ref 79–97)
PLATELETS: 364 10*3/uL (ref 150–379)
RBC: 4.29 x10E6/uL (ref 3.77–5.28)
RDW: 17.7 % — AB (ref 12.3–15.4)
WBC: 7.8 10*3/uL (ref 3.4–10.8)

## 2016-12-15 LAB — FERRITIN: Ferritin: 10 ng/mL — ABNORMAL LOW (ref 15–150)

## 2016-12-15 LAB — VITAMIN D 25 HYDROXY (VIT D DEFICIENCY, FRACTURES): Vit D, 25-Hydroxy: 27 ng/mL — ABNORMAL LOW (ref 30.0–100.0)

## 2017-04-13 ENCOUNTER — Encounter: Payer: Self-pay | Admitting: Obstetrics and Gynecology

## 2017-04-13 ENCOUNTER — Other Ambulatory Visit: Payer: Self-pay | Admitting: Obstetrics and Gynecology

## 2017-04-13 ENCOUNTER — Ambulatory Visit (INDEPENDENT_AMBULATORY_CARE_PROVIDER_SITE_OTHER): Payer: PRIVATE HEALTH INSURANCE | Admitting: Obstetrics and Gynecology

## 2017-04-13 VITALS — BP 115/84 | HR 101 | Ht 66.0 in | Wt 159.0 lb

## 2017-04-13 DIAGNOSIS — N810 Urethrocele: Secondary | ICD-10-CM

## 2017-04-13 DIAGNOSIS — Z01411 Encounter for gynecological examination (general) (routine) with abnormal findings: Secondary | ICD-10-CM

## 2017-04-13 NOTE — Patient Instructions (Signed)

## 2017-04-13 NOTE — Progress Notes (Signed)
Subjective:   Alphonzo LemmingsWhitney Joanne Chandler is a 31 y.o. 593P2012 Caucasian female here for a routine well-woman exam.  No LMP recorded. Patient is not currently having periods (Reason: Lactating).    Current complaints: feels bulging when wipes, concerned that bladder has dropped PCP: me       does desire labs  Social History: Sexual: heterosexual Marital Status: married Living situation: with family Occupation: homemaker Tobacco/alcohol: no tobacco use Illicit drugs: no history of illicit drug use  The following portions of the patient's history were reviewed and updated as appropriate: allergies, current medications, past family history, past medical history, past social history, past surgical history and problem list.  Past Medical History Past Medical History:  Diagnosis Date  . Headache(784.0)   . Ovarian cyst   . Preterm labor   . Vaginal Pap smear, abnormal    f/u ok    Past Surgical History Past Surgical History:  Procedure Laterality Date  . WISDOM TOOTH EXTRACTION      Gynecologic History [id number]  No LMP recorded. Patient is not currently having periods (Reason: Lactating). Contraception: abstinence Last Pap: ?Marland Kitchen. Results were: normal   Obstetric History OB History  Gravida Para Term Preterm AB Living  3 2 2   1 2   SAB TAB Ectopic Multiple Live Births  1     0 2    # Outcome Date GA Lbr Len/2nd Weight Sex Delivery Anes PTL Lv  3 Term 11/03/16 5640w0d / 00:26 7 lb 6.9 oz (3.37 kg) M Vag-Spont EPI  LIV  2 Term 02/08/14 4781w3d 12:30 / 02:34 6 lb 13.7 oz (3.11 kg) F Vag-Vacuum EPI  LIV  1 SAB 08/11/11              Current Medications Current Outpatient Prescriptions on File Prior to Visit  Medication Sig Dispense Refill  . Cholecalciferol (VITAMIN D3) 5000 units CAPS Take 1 capsule (5,000 Units total) by mouth daily. (Patient not taking: Reported on 04/13/2017) 120 capsule 2   No current facility-administered medications on file prior to visit.     Review of  Systems Patient denies any headaches, blurred vision, shortness of breath, chest pain, abdominal pain, problems with bowel movements, urination, or intercourse.  Objective:  BP 115/84   Pulse (!) 101   Ht 5\' 6"  (1.676 m)   Wt 159 lb (72.1 kg)   Breastfeeding? Yes   BMI 25.66 kg/m  Physical Exam  General:  Well developed, well nourished, no acute distress. She is alert and oriented x3. Skin:  Warm and dry Neck:  Midline trachea, no thyromegaly or nodules Cardiovascular: Regular rate and rhythm, no murmur heard Lungs:  Effort normal, all lung fields clear to auscultation bilaterally Breasts:  No dominant palpable mass, retraction, or nipple discharge Abdomen:  Soft, non tender, no hepatosplenomegaly or masses Pelvic:  External genitalia is normal in appearance.  The vagina is normal in appearance. The cervix is bulbous, no CMT.  Thin prep pap is done with HR HPV cotesting. Uterus is felt to be normal size, shape, and contour.  No adnexal masses or tenderness noted. Bulging noted of urethra and weak muscle tone.  Extremities:  No swelling or varicosities noted Psych:  She has a normal mood and affect  Assessment:   Healthy well-woman ``   `` Lactational amenorrhea urethracele  Plan:  Referred for pelvic floor PT and instructed on kegel's F/U 1 year for Ae, or sooner if needed   Melody Suzan NailerN Shambley, CNM

## 2017-04-14 LAB — COMPREHENSIVE METABOLIC PANEL
A/G RATIO: 1.6 (ref 1.2–2.2)
ALT: 12 IU/L (ref 0–32)
AST: 13 IU/L (ref 0–40)
Albumin: 4.3 g/dL (ref 3.5–5.5)
Alkaline Phosphatase: 117 IU/L (ref 39–117)
BUN/Creatinine Ratio: 18 (ref 9–23)
BUN: 11 mg/dL (ref 6–20)
Bilirubin Total: <0.2 mg/dL (ref 0.0–1.2)
CHLORIDE: 103 mmol/L (ref 96–106)
CO2: 22 mmol/L (ref 20–29)
Calcium: 9.6 mg/dL (ref 8.7–10.2)
Creatinine, Ser: 0.62 mg/dL (ref 0.57–1.00)
GFR calc Af Amer: 139 mL/min/{1.73_m2} (ref >59)
GFR, EST NON AFRICAN AMERICAN: 121 mL/min/{1.73_m2} (ref >59)
GLUCOSE: 79 mg/dL (ref 65–99)
Globulin, Total: 2.7 g/dL (ref 1.5–4.5)
POTASSIUM: 4.6 mmol/L (ref 3.5–5.2)
Sodium: 140 mmol/L (ref 134–144)
Total Protein: 7 g/dL (ref 6.0–8.5)

## 2017-04-14 LAB — IRON: IRON: 36 ug/dL (ref 27–159)

## 2017-04-14 LAB — VITAMIN D 25 HYDROXY (VIT D DEFICIENCY, FRACTURES): VIT D 25 HYDROXY: 25.5 ng/mL — AB (ref 30.0–100.0)

## 2017-04-21 LAB — CYTOLOGY - PAP

## 2018-04-20 ENCOUNTER — Encounter: Payer: PRIVATE HEALTH INSURANCE | Admitting: Obstetrics and Gynecology

## 2018-04-25 ENCOUNTER — Encounter: Payer: Self-pay | Admitting: Obstetrics and Gynecology

## 2018-05-17 ENCOUNTER — Encounter: Payer: Self-pay | Admitting: Obstetrics and Gynecology

## 2018-05-17 ENCOUNTER — Ambulatory Visit (INDEPENDENT_AMBULATORY_CARE_PROVIDER_SITE_OTHER): Payer: PRIVATE HEALTH INSURANCE | Admitting: Obstetrics and Gynecology

## 2018-05-17 VITALS — BP 108/77 | HR 75 | Ht 66.0 in | Wt 171.5 lb

## 2018-05-17 DIAGNOSIS — Z01419 Encounter for gynecological examination (general) (routine) without abnormal findings: Secondary | ICD-10-CM | POA: Diagnosis not present

## 2018-05-17 DIAGNOSIS — E559 Vitamin D deficiency, unspecified: Secondary | ICD-10-CM

## 2018-05-17 NOTE — Progress Notes (Signed)
Subjective:   Julia Chandler is a 32 y.o. 443P2012 Caucasian female here for a routine well-woman exam.  Patient's last menstrual period was 05/03/2018.    Current complaints: Pt has questions about when to obtain baseline mammogram due to mother having breast cancer. Pt c/o lower back pain when she has the urge to void. She denies urinary frequency or pain with urination. Pt reports pain with intercourse. PCP: me       Labs obtained today.  Social History: Sexual: heterosexual Marital Status: married Living situation: with family Occupation: homemaker Tobacco/alcohol: no tobacco use Illicit drugs: no history of illicit drug use  The following portions of the patient's history were reviewed and updated as appropriate: allergies, current medications, past family history, past medical history, past social history, past surgical history and problem list.  Past Medical History Past Medical History:  Diagnosis Date  . Headache(784.0)   . Ovarian cyst   . Preterm labor   . Vaginal Pap smear, abnormal    f/u ok    Past Surgical History Past Surgical History:  Procedure Laterality Date  . WISDOM TOOTH EXTRACTION      Gynecologic History [id number]  Patient's last menstrual period was 05/03/2018. Contraception: none Last Pap: 2018. Results were: normal   Obstetric History OB History  Gravida Para Term Preterm AB Living  3 2 2   1 2   SAB TAB Ectopic Multiple Live Births  1     0 2    # Outcome Date GA Lbr Len/2nd Weight Sex Delivery Anes PTL Lv  3 Term 11/03/16 5369w0d / 00:26 7 lb 6.9 oz (3.37 kg) M Vag-Spont EPI  LIV  2 Term 02/08/14 1774w3d 12:30 / 02:34 6 lb 13.7 oz (3.11 kg) F Vag-Vacuum EPI  LIV  1 SAB 08/11/11            Current Medications Current Outpatient Medications on File Prior to Visit  Medication Sig Dispense Refill  . Cholecalciferol (VITAMIN D3) 5000 units CAPS Take 1 capsule (5,000 Units total) by mouth daily. (Patient not taking: Reported on 04/13/2017) 120  capsule 2   No current facility-administered medications on file prior to visit.     Review of Systems Patient denies any headaches, blurred vision, shortness of breath, chest pain, abdominal pain, problems with bowel movements. Pt reports pain with intercourse. Pt reports lower back pain when she has the urge to void.   Objective:  BP 108/77   Pulse 75   Ht 5\' 6"  (1.676 m)   Wt 171 lb 8 oz (77.8 kg)   LMP 05/03/2018   BMI 27.68 kg/m  Physical Exam  General:  Well developed, well nourished, no acute distress. She is alert and oriented x3. Skin:  Warm and dry Neck:  Midline trachea, no thyromegaly or nodules Cardiovascular: Regular rate and rhythm, no murmur heard Lungs:  Effort normal, all lung fields clear to auscultation bilaterally Breasts:  No dominant palpable mass, retraction, or nipple discharge Abdomen:  Soft, non tender, no hepatosplenomegaly or masses Pelvic:  External genitalia is normal in appearance.  The vagina is normal in appearance. The cervix is bulbous, no CMT.  Thin prep pap is not done . Uterus is felt to be normal size, shape, and contour.  No adnexal masses or tenderness noted. Extremities:  No swelling or varicosities noted Psych:  She has a normal mood and affect  Assessment:   Healthy well-woman exam Hx of Low Vitamin D Lower back pain  Family history of breast cancer  in mom and melanoma in dad  Plan:  Labs obtained to follow up accordingly. Genetic screening for cancer markers. Counseled that mammogram still due at age 32 for baseline. Follow up with chiropractor r/t back pain and painful intercourse. F/U 1 year for AE, or sooner if needed   Melody Suzan Nailer, CNM

## 2018-05-17 NOTE — Patient Instructions (Signed)
Preventive Care 18-39 Years, Female Preventive care refers to lifestyle choices and visits with your health care provider that can promote health and wellness. What does preventive care include?  A yearly physical exam. This is also called an annual well check.  Dental exams once or twice a year.  Routine eye exams. Ask your health care provider how often you should have your eyes checked.  Personal lifestyle choices, including: ? Daily care of your teeth and gums. ? Regular physical activity. ? Eating a healthy diet. ? Avoiding tobacco and drug use. ? Limiting alcohol use. ? Practicing safe sex. ? Taking vitamin and mineral supplements as recommended by your health care provider. What happens during an annual well check? The services and screenings done by your health care provider during your annual well check will depend on your age, overall health, lifestyle risk factors, and family history of disease. Counseling Your health care provider may ask you questions about your:  Alcohol use.  Tobacco use.  Drug use.  Emotional well-being.  Home and relationship well-being.  Sexual activity.  Eating habits.  Work and work Statistician.  Method of birth control.  Menstrual cycle.  Pregnancy history.  Screening You may have the following tests or measurements:  Height, weight, and BMI.  Diabetes screening. This is done by checking your blood sugar (glucose) after you have not eaten for a while (fasting).  Blood pressure.  Lipid and cholesterol levels. These may be checked every 5 years starting at age 38.  Skin check.  Hepatitis C blood test.  Hepatitis B blood test.  Sexually transmitted disease (STD) testing.  BRCA-related cancer screening. This may be done if you have a family history of breast, ovarian, tubal, or peritoneal cancers.  Pelvic exam and Pap test. This may be done every 3 years starting at age 38. Starting at age 30, this may be done  every 5 years if you have a Pap test in combination with an HPV test.  Discuss your test results, treatment options, and if necessary, the need for more tests with your health care provider. Vaccines Your health care provider may recommend certain vaccines, such as:  Influenza vaccine. This is recommended every year.  Tetanus, diphtheria, and acellular pertussis (Tdap, Td) vaccine. You may need a Td booster every 10 years.  Varicella vaccine. You may need this if you have not been vaccinated.  HPV vaccine. If you are 39 or younger, you may need three doses over 6 months.  Measles, mumps, and rubella (MMR) vaccine. You may need at least one dose of MMR. You may also need a second dose.  Pneumococcal 13-valent conjugate (PCV13) vaccine. You may need this if you have certain conditions and were not previously vaccinated.  Pneumococcal polysaccharide (PPSV23) vaccine. You may need one or two doses if you smoke cigarettes or if you have certain conditions.  Meningococcal vaccine. One dose is recommended if you are age 68-21 years and a first-year college student living in a residence hall, or if you have one of several medical conditions. You may also need additional booster doses.  Hepatitis A vaccine. You may need this if you have certain conditions or if you travel or work in places where you may be exposed to hepatitis A.  Hepatitis B vaccine. You may need this if you have certain conditions or if you travel or work in places where you may be exposed to hepatitis B.  Haemophilus influenzae type b (Hib) vaccine. You may need this  if you have certain risk factors.  Talk to your health care provider about which screenings and vaccines you need and how often you need them. This information is not intended to replace advice given to you by your health care provider. Make sure you discuss any questions you have with your health care provider. Document Released: 12/07/2001 Document Revised:  06/30/2016 Document Reviewed: 08/12/2015 Elsevier Interactive Patient Education  2018 Elsevier Inc.  

## 2018-05-18 LAB — VITAMIN D 25 HYDROXY (VIT D DEFICIENCY, FRACTURES): VIT D 25 HYDROXY: 24.8 ng/mL — AB (ref 30.0–100.0)

## 2018-05-31 ENCOUNTER — Encounter: Payer: Self-pay | Admitting: Obstetrics and Gynecology

## 2019-05-23 ENCOUNTER — Encounter: Payer: PRIVATE HEALTH INSURANCE | Admitting: Obstetrics and Gynecology

## 2019-07-11 ENCOUNTER — Encounter: Payer: Self-pay | Admitting: Obstetrics and Gynecology

## 2019-08-17 ENCOUNTER — Encounter: Payer: Self-pay | Admitting: Obstetrics and Gynecology

## 2019-08-17 ENCOUNTER — Other Ambulatory Visit: Payer: Self-pay

## 2019-08-17 ENCOUNTER — Ambulatory Visit (INDEPENDENT_AMBULATORY_CARE_PROVIDER_SITE_OTHER): Payer: PRIVATE HEALTH INSURANCE | Admitting: Obstetrics and Gynecology

## 2019-08-17 VITALS — BP 110/82 | HR 77 | Ht 66.0 in | Wt 168.0 lb

## 2019-08-17 DIAGNOSIS — N390 Urinary tract infection, site not specified: Secondary | ICD-10-CM | POA: Diagnosis not present

## 2019-08-17 DIAGNOSIS — L235 Allergic contact dermatitis due to other chemical products: Secondary | ICD-10-CM | POA: Diagnosis not present

## 2019-08-17 MED ORDER — MUPIROCIN CALCIUM 2 % EX CREA: 1.0000 "application " | TOPICAL_CREAM | Freq: Two times a day (BID) | CUTANEOUS | 0 refills | Status: DC

## 2019-08-17 NOTE — Progress Notes (Signed)
  Subjective:     Patient ID: Lenise Jr, female   DOB: Jul 29, 1986, 33 y.o.   MRN: [id number]  HPI C/o burning and itching externally for the last 6 weeks ago. Treated with OTC cream.  Has increase increase in sexual activity with spouse and switch lubricant and it got worse, also worse after menses. Also got message that cottenelle wipes were recalled due to contaminated with bacteria in them.  Irritation started right after this.  Not currently using any BC.  Review of Systems  Genitourinary: Positive for vaginal discharge and vaginal pain.  All other systems reviewed and are negative.      Objective:   Physical Exam A&Ox4 Well groomed female Vitals with BMI 08/17/2019 05/17/2018 04/13/2017  Height 5\' 6"  5\' 6"  5\' 6"   Weight 168 lbs 171 lbs 8 oz 159 lbs  BMI 27.13 17.49 44.9  Systolic 675 916 384  Diastolic 82 77 84  Pulse 77 75 101   Urinalysis    Component Value Date/Time   COLORURINE YELLOW 01/02/2014 0024   APPEARANCEUR Clear 03/18/2016 1046   LABSPEC 1.010 01/02/2014 0024   PHURINE 7.0 01/02/2014 0024   GLUCOSEU Negative 03/18/2016 1046   HGBUR NEGATIVE 01/02/2014 0024   BILIRUBINUR neg 11/03/2016 1015   BILIRUBINUR Negative 03/18/2016 1046   KETONESUR NEGATIVE 01/02/2014 0024   PROTEINUR neg 11/03/2016 1015   PROTEINUR Negative 03/18/2016 1046   PROTEINUR NEGATIVE 01/02/2014 0024   UROBILINOGEN 0.2 11/03/2016 1015   UROBILINOGEN 0.2 01/02/2014 0024   NITRITE neg 11/03/2016 1015   NITRITE Negative 03/18/2016 1046   NITRITE NEGATIVE 01/02/2014 0024   LEUKOCYTESUR Negative 11/03/2016 1015   LEUKOCYTESUR Negative 03/18/2016 1046   Microscopic wet-mount exam shows negative for pathogens, normal epithelial cells. Pelvic exam: normal external genitalia, vulva, vagina, cervix, uterus and adnexa, VULVA: vulvar erythema with infected sores on right groin.    Assessment:     Contact dermatitis and secondary infection    Plan:     bactroban ointment to affected  area bid x 1 week and prn. Instructed not to use any products in genital regions with fragrances. RTC in 4 weeks for planned annual.  Azad Calame,CNM

## 2019-08-17 NOTE — Progress Notes (Signed)
Patient is here with c/o vaginal discomfort. States she doesn't think it is a UTI. States her and her husband are very active sexually and that seems to be when it is the worst.Refused flu shot.

## 2019-09-14 ENCOUNTER — Encounter: Payer: Self-pay | Admitting: Obstetrics and Gynecology

## 2019-10-05 ENCOUNTER — Encounter: Payer: Self-pay | Admitting: Certified Nurse Midwife

## 2019-12-14 ENCOUNTER — Encounter: Payer: PRIVATE HEALTH INSURANCE | Admitting: Certified Nurse Midwife

## 2019-12-24 ENCOUNTER — Encounter: Payer: PRIVATE HEALTH INSURANCE | Admitting: Certified Nurse Midwife

## 2020-03-13 ENCOUNTER — Other Ambulatory Visit: Payer: Self-pay

## 2020-03-13 ENCOUNTER — Other Ambulatory Visit (HOSPITAL_COMMUNITY)
Admission: RE | Admit: 2020-03-13 | Discharge: 2020-03-13 | Disposition: A | Discharge status: Home / Self Care | Payer: PRIVATE HEALTH INSURANCE | Source: Ambulatory Visit | Attending: Certified Nurse Midwife | Admitting: Certified Nurse Midwife

## 2020-03-13 ENCOUNTER — Ambulatory Visit: Payer: PRIVATE HEALTH INSURANCE | Admitting: Certified Nurse Midwife

## 2020-03-13 ENCOUNTER — Encounter: Payer: Self-pay | Admitting: Certified Nurse Midwife

## 2020-03-13 VITALS — BP 114/72 | HR 87 | Ht 66.0 in | Wt 166.2 lb

## 2020-03-13 DIAGNOSIS — N898 Other specified noninflammatory disorders of vagina: Secondary | ICD-10-CM | POA: Diagnosis not present

## 2020-03-13 DIAGNOSIS — L292 Pruritus vulvae: Secondary | ICD-10-CM | POA: Insufficient documentation

## 2020-03-13 DIAGNOSIS — N949 Unspecified condition associated with female genital organs and menstrual cycle: Secondary | ICD-10-CM

## 2020-03-13 LAB — POCT URINALYSIS DIPSTICK
Bilirubin, UA: NEGATIVE
Blood, UA: NEGATIVE
Glucose, UA: NEGATIVE
Ketones, UA: NEGATIVE
Leukocytes, UA: NEGATIVE
Nitrite, UA: NEGATIVE
Protein, UA: NEGATIVE
Spec Grav, UA: 1.02 (ref 1.010–1.025)
Urobilinogen, UA: 0.2 E.U./dL
pH, UA: 5 (ref 5.0–8.0)

## 2020-03-13 NOTE — Progress Notes (Signed)
I have seen, interviewed, and examined the patient in conjunction with the [employer] Nurse Practitioner student and affirm the diagnosis and management plan.   Gunnar Bulla, CNM [facility], [facility] 03/13/20 1:27 PM

## 2020-03-13 NOTE — Progress Notes (Signed)
GYN ENCOUNTER NOTE  Subjective:       Julia Chandler is a 34 y.o. G66P2012 female is here for gynecologic evaluation of the following issues:  1. Vaginal Itching  2. Vulvar Itching 3. Vaginal Burning  Reports one year of vaginal itching and burning. Symptoms worsen around the time of ovulation and last until period starts.   Previously seen for this and given Bactroban which helped some but symptoms returned three weeks later and have continued.   Recently her daughter was diagnoses with Leukemia which is a new source of stress.    Denies difficulty breathing or respiratory distress, chest pain, abdominal pain, excessive vaginal bleeding, dysuria, leg pain or swelling  Gynecologic History  Patient's last menstrual period was 02/27/2020 (exact date).  Contraception: none   Last Pap: 04/13/2017. Results were: Neg/Neg  Obstetric History  OB History  Gravida Para Term Preterm AB Living  3 2 2   1 2   SAB TAB Ectopic Multiple Live Births  1     0 2    # Outcome Date GA Lbr Len/2nd Weight Sex Delivery Anes PTL Lv  3 Term 11/03/16 [redacted]w[redacted]d / 00:26 7 lb 6.9 oz (3.37 kg) M Vag-Spont EPI  LIV  2 Term 02/08/14 [redacted]w[redacted]d 12:30 / 02:34 6 lb 13.7 oz (3.11 kg) F Vag-Vacuum EPI  LIV  1 SAB 08/11/11            Past Medical History:  Diagnosis Date  . Headache(784.0)   . Ovarian cyst   . Preterm labor   . Vaginal Pap smear, abnormal    f/u ok    Past Surgical History:  Procedure Laterality Date  . WISDOM TOOTH EXTRACTION      Current Outpatient Medications on File Prior to Visit  Medication Sig Dispense Refill  . Cholecalciferol (VITAMIN D3) 5000 units CAPS Take 1 capsule (5,000 Units total) by mouth daily. 120 capsule 2  . mupirocin cream (BACTROBAN) 2 % Apply 1 application topically 2 (two) times daily. 15 g 0   No current facility-administered medications on file prior to visit.    Allergies  Allergen Reactions  . Vicodin [Hydrocodone-Acetaminophen] Rash  . Percocet  [Oxycodone-Acetaminophen] Rash    Social History   Socioeconomic History  . Marital status: Married    Spouse name: Not on file  . Number of children: Not on file  . Years of education: Not on file  . Highest education level: Not on file  Occupational History  . Not on file  Tobacco Use  . Smoking status: Never Smoker  . Smokeless tobacco: Never Used  Substance and Sexual Activity  . Alcohol use: No  . Drug use: No  . Sexual activity: Yes    Birth control/protection: None  Other Topics Concern  . Not on file  Social History Narrative  . Not on file   Social Determinants of Health   Financial Resource Strain:   . Difficulty of Paying Living Expenses:   Food Insecurity:   . Worried About Charity fundraiser in the Last Year:   . Arboriculturist in the Last Year:   Transportation Needs:   . Film/video editor (Medical):   Marland Kitchen Lack of Transportation (Non-Medical):   Physical Activity:   . Days of Exercise per Week:   . Minutes of Exercise per Session:   Stress:   . Feeling of Stress :   Social Connections:   . Frequency of Communication with Friends and Family:   .  Frequency of Social Gatherings with Friends and Family:   . Attends Religious Services:   . Active Member of Clubs or Organizations:   . Attends Banker Meetings:   Marland Kitchen Marital Status:   Intimate Partner Violence:   . Fear of Current or Ex-Partner:   . Emotionally Abused:   Marland Kitchen Physically Abused:   . Sexually Abused:     Family History  Problem Relation Age of Onset  . Cancer Mother   . Miscarriages / [place] Mother   . Hypertension Father   . Anxiety disorder Sister   . Thyroid disease Maternal Grandmother   . Cancer Maternal Grandfather   . Heart disease Paternal Grandmother   . Hypertension Paternal Grandmother   . Heart disease Paternal Grandfather   . Hypertension Paternal Grandfather     The following portions of the patient's history were reviewed and updated as  appropriate: allergies, current medications, past family history, past medical history, past social history, past surgical history and problem list.  Review of Systems  ROS negative except as noted above. Information obtained from patient.  Objective:   BP 114/72   Pulse 87   Ht 5\' 6"  (1.676 m)   Wt 166 lb 4 oz (75.4 kg)   LMP 02/27/2020 (Exact Date)   Breastfeeding No   BMI 26.83 kg/m    CONSTITUTIONAL: Well-developed, well-nourished female in no acute distress.   PELVIC:  External Genitalia: Normal    Recent Results (from the past 2160 hour(s))  POCT urinalysis dipstick     Status: None   Collection Time: 03/13/20  1:16 PM  Result Value Ref Range   Color, UA yellow    Clarity, UA clear    Glucose, UA Negative Negative   Bilirubin, UA neg    Ketones, UA neg    Spec Grav, UA 1.020 1.010 - 1.025   Blood, UA neg    pH, UA 5.0 5.0 - 8.0   Protein, UA Negative Negative   Urobilinogen, UA 0.2 0.2 or 1.0 E.U./dL   Nitrite, UA neg    Leukocytes, UA Negative Negative   Appearance     Odor      Assessment:   1. Vaginal itching  - POCT urinalysis dipstick - Cervicovaginal ancillary only  2. Vulvar itching  - POCT urinalysis dipstick - Cervicovaginal ancillary only  3. Vaginal burning  - POCT urinalysis dipstick - Cervicovaginal ancillary only     Plan:   Hylafem samples provided.  Aptima swab collected. See orders.  Discussed vaginal hygiene with patient.  See AVS.  Reviewed red flags and when to call the office.   RTC  As needed if symptoms worsen or fails to improve.   03/15/20 RN [facility] [employer] 03/13/20 12:27 PM

## 2020-03-13 NOTE — Patient Instructions (Addendum)
Vaginitis  Vaginitis is irritation and swelling (inflammation) of the vagina. It happens when normal bacteria and yeast in the vagina grow too much. There are many types of this condition. Treatment will depend on the type you have. Follow these instructions at home: Lifestyle  Keep your vagina area clean and dry. ? Avoid using soap. ? Rinse the area with water.  Do not do the following until your doctor says it is okay: ? Wash and clean out the vagina (douche). ? Use tampons. ? Have sex.  Wipe from front to back after going to the bathroom.  Let air reach your vagina. ? Wear cotton underwear. ? Do not wear:  Underwear while you sleep.  Tight pants.  Thong underwear.  Underwear or nylons without a cotton panel. ? Take off any wet clothing, such as bathing suits, as soon as possible.  Use gentle, non-scented products. Do not use things that can irritate the vagina, such as fabric softeners. Avoid the following products if they are scented: ? Feminine sprays. ? Detergents. ? Tampons. ? Feminine hygiene products. ? Soaps or bubble baths.  Practice safe sex and use condoms. General instructions  Take over-the-counter and prescription medicines only as told by your doctor.  If you were prescribed an antibiotic medicine, take or use it as told by your doctor. Do not stop taking or using the antibiotic even if you start to feel better.  Keep all follow-up visits as told by your doctor. This is important. Contact a doctor if:  You have pain in your belly.  You have a fever.  Your symptoms last for more than 2-3 days. Get help right away if:  You have a fever and your symptoms get worse all of a sudden. Summary  Vaginitis is irritation and swelling of the vagina. It can happen when the normal bacteria and yeast in the vagina grow too much. There are many types.  Treatment will depend on the type you have.  Do not douche, use tampons , or have sex until your health  care provider approves. When you can return to sex, practice safe sex and use condoms. This information is not intended to replace advice given to you by your health care provider. Make sure you discuss any questions you have with your health care provider. Document Revised: 09/23/2017 Document Reviewed: 11/02/2016 Elsevier Patient Education  2020 Elsevier Inc.   Boric Acid vaginal suppository What is this medicine? BORIC ACID (BOHR ik AS id) helps to promote the proper acid balance in the vagina. It is used to help treat yeast infections of the vagina and relieve symptoms such as itching and burning. This medicine may be used for other purposes; ask your health care provider or pharmacist if you have questions. COMMON BRAND NAME(S): Hylafem What should I tell my health care provider before I take this medicine? They need to know if you have any of these conditions:  diabetes  frequent infections  HIV or AIDS  immune system problems  an unusual or allergic reaction to boric acid, other medicines, foods, dyes, or preservatives  pregnant or trying to get pregnant  breast-feeding How should I use this medicine? This medicine is for use in the vagina. Do not take by mouth. Follow the directions on the prescription label. Read package directions carefully before using. Wash hands before and after use. Use this medicine at bedtime, unless otherwise directed by your doctor. Do not use your medicine more often than directed. Do not stop using  this medicine except on your doctor's advice. Talk to your pediatrician regarding the use of this medicine in children. This medicine is not approved for use in children. Overdosage: If you think you have taken too much of this medicine contact a poison control center or emergency room at once. NOTE: This medicine is only for you. Do not share this medicine with others. What if I miss a dose? If you miss a dose, use it as soon as you can. If it is  almost time for your next dose, use only that dose. Do not use double or extra doses. What may interact with this medicine? Interactions are not expected. Do not use any other vaginal products without telling your doctor or health care professional. This list may not describe all possible interactions. Give your health care provider a list of all the medicines, herbs, non-prescription drugs, or dietary supplements you use. Also tell them if you smoke, drink alcohol, or use illegal drugs. Some items may interact with your medicine. What should I watch for while using this medicine? Tell your doctor or health care professional if your symptoms do not start to get better within a few days. It is better not to have sex until you have finished your treatment. This medicine may damage condoms or diaphragms and cause them not to work properly. It may also decrease the effect of vaginal spermicides. Do not rely on any of these methods to prevent sexually transmitted diseases or pregnancy while you are using this medicine. Vaginal medicines usually will come out of the vagina during treatment. To keep the medicine from getting on your clothing, wear a panty liner. The use of tampons is not recommended. To help clear up the infection, wear freshly washed cotton, not synthetic, underwear. What side effects may I notice from receiving this medicine? Side effects that you should report to your doctor or health care professional as soon as possible:  allergic reactions like skin rash, itching or hives  vaginal irritation, redness, or burning Side effects that usually do not require medical attention (report to your doctor or health care professional if they continue or are bothersome):  vaginal discharge This list may not describe all possible side effects. Call your doctor for medical advice about side effects. You may report side effects to FDA at 1-800-FDA-1088. Where should I keep my medicine? Keep out of  the reach of children. Store in a cool, dry place between 15 and 30 degrees C (59 and 86 degrees F). Keep away from sunlight. Throw away any unused medicine after the expiration date. NOTE: This sheet is a summary. It may not cover all possible information. If you have questions about this medicine, talk to your doctor, pharmacist, or health care provider.  2020 Elsevier/Gold Standard (2015-11-13 07:29:58)

## 2020-03-17 ENCOUNTER — Other Ambulatory Visit (INDEPENDENT_AMBULATORY_CARE_PROVIDER_SITE_OTHER): Payer: PRIVATE HEALTH INSURANCE | Admitting: Certified Nurse Midwife

## 2020-03-17 DIAGNOSIS — B373 Candidiasis of vulva and vagina: Secondary | ICD-10-CM

## 2020-03-17 DIAGNOSIS — B3731 Acute candidiasis of vulva and vagina: Secondary | ICD-10-CM

## 2020-03-17 LAB — CERVICOVAGINAL ANCILLARY ONLY
Bacterial Vaginitis (gardnerella): NEGATIVE
Candida Glabrata: NEGATIVE
Candida Vaginitis: POSITIVE — AB
Chlamydia: NEGATIVE
Comment: NEGATIVE
Comment: NEGATIVE
Comment: NEGATIVE
Comment: NEGATIVE
Comment: NEGATIVE
Comment: NORMAL
Neisseria Gonorrhea: NEGATIVE
Trichomonas: NEGATIVE

## 2020-03-17 MED ORDER — FLUCONAZOLE 150 MG PO TABS: 150.0000 mg | ORAL_TABLET | Freq: Once | ORAL | 0 refills | Status: AC

## 2020-03-17 NOTE — Progress Notes (Signed)
Rx Diflucan, see orders.    Gunnar Bulla, CNM [facility], [facility] 03/17/20 5:16 PM

## 2020-07-31 ENCOUNTER — Other Ambulatory Visit: Payer: Self-pay

## 2020-07-31 ENCOUNTER — Ambulatory Visit (INDEPENDENT_AMBULATORY_CARE_PROVIDER_SITE_OTHER): Payer: Self-pay | Admitting: Certified Nurse Midwife

## 2020-07-31 DIAGNOSIS — Z8659 Personal history of other mental and behavioral disorders: Secondary | ICD-10-CM

## 2020-07-31 DIAGNOSIS — F32A Depression, unspecified: Secondary | ICD-10-CM

## 2020-07-31 DIAGNOSIS — Z8759 Personal history of other complications of pregnancy, childbirth and the puerperium: Secondary | ICD-10-CM

## 2020-07-31 DIAGNOSIS — F419 Anxiety disorder, unspecified: Secondary | ICD-10-CM

## 2020-07-31 MED ORDER — ESCITALOPRAM OXALATE 10 MG PO TABS: 10.0000 mg | ORAL_TABLET | Freq: Every day | ORAL | 1 refills | Status: DC

## 2020-07-31 NOTE — Patient Instructions (Signed)
Escitalopram tablets What is this medicine? ESCITALOPRAM (es sye TAL oh pram) is used to treat depression and certain types of anxiety. This medicine may be used for other purposes; ask your health care provider or pharmacist if you have questions. COMMON BRAND NAME(S): Lexapro What should I tell my health care provider before I take this medicine? They need to know if you have any of these conditions:  bipolar disorder or a family history of bipolar disorder  diabetes  glaucoma  heart disease  kidney or liver disease  receiving electroconvulsive therapy  seizures (convulsions)  suicidal thoughts, plans, or attempt by you or a family member  an unusual or allergic reaction to escitalopram, the related drug citalopram, other medicines, foods, dyes, or preservatives  pregnant or trying to become pregnant  breast-feeding How should I use this medicine? Take this medicine by mouth with a glass of water. Follow the directions on the prescription label. You can take it with or without food. If it upsets your stomach, take it with food. Take your medicine at regular intervals. Do not take it more often than directed. Do not stop taking this medicine suddenly except upon the advice of your doctor. Stopping this medicine too quickly may cause serious side effects or your condition may worsen. A special MedGuide will be given to you by the pharmacist with each prescription and refill. Be sure to read this information carefully each time. Talk to your pediatrician regarding the use of this medicine in children. Special care may be needed. Overdosage: If you think you have taken too much of this medicine contact a poison control center or emergency room at once. NOTE: This medicine is only for you. Do not share this medicine with others. What if I miss a dose? If you miss a dose, take it as soon as you can. If it is almost time for your next dose, take only that dose. Do not take double or  extra doses. What may interact with this medicine? Do not take this medicine with any of the following medications:  certain medicines for fungal infections like fluconazole, itraconazole, ketoconazole, posaconazole, voriconazole  cisapride  citalopram  dronedarone  linezolid  MAOIs like Carbex, Eldepryl, Marplan, Nardil, and Parnate  methylene blue (injected into a vein)  pimozide  thioridazine This medicine may also interact with the following medications:  alcohol  amphetamines  aspirin and aspirin-like medicines  carbamazepine  certain medicines for depression, anxiety, or psychotic disturbances  certain medicines for migraine headache like almotriptan, eletriptan, frovatriptan, naratriptan, rizatriptan, sumatriptan, zolmitriptan  certain medicines for sleep  certain medicines that treat or prevent blood clots like warfarin, enoxaparin, dalteparin  cimetidine  diuretics  dofetilide  fentanyl  furazolidone  isoniazid  lithium  metoprolol  NSAIDs, medicines for pain and inflammation, like ibuprofen or naproxen  other medicines that prolong the QT interval (cause an abnormal heart rhythm)  procarbazine  rasagiline  supplements like St. John's wort, kava kava, valerian  tramadol  tryptophan  ziprasidone This list may not describe all possible interactions. Give your health care provider a list of all the medicines, herbs, non-prescription drugs, or dietary supplements you use. Also tell them if you smoke, drink alcohol, or use illegal drugs. Some items may interact with your medicine. What should I watch for while using this medicine? Tell your doctor if your symptoms do not get better or if they get worse. Visit your doctor or health care professional for regular checks on your progress. Because it may   take several weeks to see the full effects of this medicine, it is important to continue your treatment as prescribed by your doctor. Patients  and their families should watch out for new or worsening thoughts of suicide or depression. Also watch out for sudden changes in feelings such as feeling anxious, agitated, panicky, irritable, hostile, aggressive, impulsive, severely restless, overly excited and hyperactive, or not being able to sleep. If this happens, especially at the beginning of treatment or after a change in dose, call your health care professional. You may get drowsy or dizzy. Do not drive, use machinery, or do anything that needs mental alertness until you know how this medicine affects you. Do not stand or sit up quickly, especially if you are an older patient. This reduces the risk of dizzy or fainting spells. Alcohol may interfere with the effect of this medicine. Avoid alcoholic drinks. Your mouth may get dry. Chewing sugarless gum or sucking hard candy, and drinking plenty of water may help. Contact your doctor if the problem does not go away or is severe. What side effects may I notice from receiving this medicine? Side effects that you should report to your doctor or health care professional as soon as possible:  allergic reactions like skin rash, itching or hives, swelling of the face, lips, or tongue  anxious  black, tarry stools  changes in vision  confusion  elevated mood, decreased need for sleep, racing thoughts, impulsive behavior  eye pain  fast, irregular heartbeat  feeling faint or lightheaded, falls  feeling agitated, angry, or irritable  hallucination, loss of contact with reality  loss of balance or coordination  loss of memory  painful or prolonged erections  restlessness, pacing, inability to keep still  seizures  stiff muscles  suicidal thoughts or other mood changes  trouble sleeping  unusual bleeding or bruising  unusually weak or tired  vomiting Side effects that usually do not require medical attention (report to your doctor or health care professional if they  continue or are bothersome):  changes in appetite  change in sex drive or performance  headache  increased sweating  indigestion, nausea  tremors This list may not describe all possible side effects. Call your doctor for medical advice about side effects. You may report side effects to FDA at 1-800-FDA-1088. Where should I keep my medicine? Keep out of reach of children. Store at room temperature between 15 and 30 degrees C (59 and 86 degrees F). Throw away any unused medicine after the expiration date. NOTE: This sheet is a summary. It may not cover all possible information. If you have questions about this medicine, talk to your doctor, pharmacist, or health care provider.  2020 Elsevier/Gold Standard (2018-10-02 11:21:44)  

## 2020-07-31 NOTE — Progress Notes (Signed)
A call was transferred from Ardelle Lesches- for a televisit for depression and anxiety.DOB as identifier.PhQ9=27; GAD = 18. Call transferred to [facility] CNM.

## 2020-08-03 NOTE — Progress Notes (Signed)
Virtual Visit via Telephone Note  I connected with Ileana Chalupa on 08/03/20 at  1:45 PM EDT by telephone and verified that I am speaking with the correct person using two identifiers.  Location:  Patient: Julia Chandler (home)  Provider: Serafina Royals, CNM ([facility], [facility])   I discussed the limitations, risks, security and privacy concerns of performing an evaluation and management service by telephone and the availability of in person appointments. I also discussed with the patient that there may be a patient responsible charge related to this service. The patient expressed understanding and agreed to proceed.   History of Present Illness:  Patient called for televisit mood check. Reports increased feelings of anxiety and depression since daughter's diagnosis of leukemia in March.   History significant for postpartum depression. Previously on Lexapro, wishes to restart medication.   Daughter's psychologist is attempting to find someone for her to speak to while the are up at [facility] for treatments.    Observations/Objective:  Depression screen PHQ 2/9 07/31/2020  Decreased Interest 3  Down, Depressed, Hopeless 3  PHQ - 2 Score 6  Altered sleeping 3  Tired, decreased energy 3  Change in appetite 3  Feeling bad or failure about yourself  3  Trouble concentrating 3  Moving slowly or fidgety/restless 3  Suicidal thoughts 3  PHQ-9 Score 27  Difficult doing work/chores Extremely dIfficult   GAD 7 : Generalized Anxiety Score 07/31/2020  Nervous, Anxious, on Edge 3  Control/stop worrying 3  Worry too much - different things 3  Trouble relaxing 3  Restless 0  Easily annoyed or irritable 3  Afraid - awful might happen 3  Total GAD 7 Score 18  Anxiety Difficulty Extremely difficult    Assessment and Plan:  History of postpartum depression Depression and anxiety   Follow Up Instructions:  Rx Lexapro, see orders.   Reviewed red flag symptoms and when to  call.   RTC x 4-6 weeks for virtual medication check or sooner if needed.    I discussed the assessment and treatment plan with the patient. The patient was provided an opportunity to ask questions and all were answered. The patient agreed with the plan and demonstrated an understanding of the instructions.   The patient was advised to call back or seek an in-person evaluation if the symptoms worsen or if the condition fails to improve as anticipated.  I provided 7 minutes of non-face-to-face time during this encounter.   Serafina Royals, CNM  [facility], [facility]

## 2020-08-07 ENCOUNTER — Other Ambulatory Visit: Payer: Self-pay | Admitting: Certified Nurse Midwife

## 2020-08-07 DIAGNOSIS — B373 Candidiasis of vulva and vagina: Secondary | ICD-10-CM

## 2020-08-07 DIAGNOSIS — B3731 Acute candidiasis of vulva and vagina: Secondary | ICD-10-CM

## 2020-08-07 MED ORDER — FLUCONAZOLE 150 MG PO TABS: 150.0000 mg | ORAL_TABLET | ORAL | 3 refills | Status: DC

## 2020-08-07 NOTE — Progress Notes (Signed)
Rx Diflucan, see orders.    Serafina Royals, CNM [facility], [facility] 08/07/20 9:48 AM

## 2020-09-08 ENCOUNTER — Other Ambulatory Visit: Payer: Self-pay

## 2020-09-08 ENCOUNTER — Encounter: Payer: Self-pay | Admitting: Certified Nurse Midwife

## 2020-09-08 ENCOUNTER — Ambulatory Visit (INDEPENDENT_AMBULATORY_CARE_PROVIDER_SITE_OTHER): Payer: Self-pay | Admitting: Certified Nurse Midwife

## 2020-09-08 DIAGNOSIS — Z8659 Personal history of other mental and behavioral disorders: Secondary | ICD-10-CM

## 2020-09-08 DIAGNOSIS — F32A Depression, unspecified: Secondary | ICD-10-CM

## 2020-09-08 DIAGNOSIS — F419 Anxiety disorder, unspecified: Secondary | ICD-10-CM

## 2020-09-08 DIAGNOSIS — Z8759 Personal history of other complications of pregnancy, childbirth and the puerperium: Secondary | ICD-10-CM

## 2020-09-08 MED ORDER — ESCITALOPRAM OXALATE 10 MG PO TABS: 10.0000 mg | ORAL_TABLET | Freq: Every day | ORAL | 3 refills | Status: DC

## 2020-09-08 NOTE — Patient Instructions (Signed)
Escitalopram tablets What is this medicine? ESCITALOPRAM (es sye TAL oh pram) is used to treat depression and certain types of anxiety. This medicine may be used for other purposes; ask your health care provider or pharmacist if you have questions. COMMON BRAND NAME(S): Lexapro What should I tell my health care provider before I take this medicine? They need to know if you have any of these conditions:  bipolar disorder or a family history of bipolar disorder  diabetes  glaucoma  heart disease  kidney or liver disease  receiving electroconvulsive therapy  seizures (convulsions)  suicidal thoughts, plans, or attempt by you or a family member  an unusual or allergic reaction to escitalopram, the related drug citalopram, other medicines, foods, dyes, or preservatives  pregnant or trying to become pregnant  breast-feeding How should I use this medicine? Take this medicine by mouth with a glass of water. Follow the directions on the prescription label. You can take it with or without food. If it upsets your stomach, take it with food. Take your medicine at regular intervals. Do not take it more often than directed. Do not stop taking this medicine suddenly except upon the advice of your doctor. Stopping this medicine too quickly may cause serious side effects or your condition may worsen. A special MedGuide will be given to you by the pharmacist with each prescription and refill. Be sure to read this information carefully each time. Talk to your pediatrician regarding the use of this medicine in children. Special care may be needed. Overdosage: If you think you have taken too much of this medicine contact a poison control center or emergency room at once. NOTE: This medicine is only for you. Do not share this medicine with others. What if I miss a dose? If you miss a dose, take it as soon as you can. If it is almost time for your next dose, take only that dose. Do not take double or  extra doses. What may interact with this medicine? Do not take this medicine with any of the following medications:  certain medicines for fungal infections like fluconazole, itraconazole, ketoconazole, posaconazole, voriconazole  cisapride  citalopram  dronedarone  linezolid  MAOIs like Carbex, Eldepryl, Marplan, Nardil, and Parnate  methylene blue (injected into a vein)  pimozide  thioridazine This medicine may also interact with the following medications:  alcohol  amphetamines  aspirin and aspirin-like medicines  carbamazepine  certain medicines for depression, anxiety, or psychotic disturbances  certain medicines for migraine headache like almotriptan, eletriptan, frovatriptan, naratriptan, rizatriptan, sumatriptan, zolmitriptan  certain medicines for sleep  certain medicines that treat or prevent blood clots like warfarin, enoxaparin, dalteparin  cimetidine  diuretics  dofetilide  fentanyl  furazolidone  isoniazid  lithium  metoprolol  NSAIDs, medicines for pain and inflammation, like ibuprofen or naproxen  other medicines that prolong the QT interval (cause an abnormal heart rhythm)  procarbazine  rasagiline  supplements like St. John's wort, kava kava, valerian  tramadol  tryptophan  ziprasidone This list may not describe all possible interactions. Give your health care provider a list of all the medicines, herbs, non-prescription drugs, or dietary supplements you use. Also tell them if you smoke, drink alcohol, or use illegal drugs. Some items may interact with your medicine. What should I watch for while using this medicine? Tell your doctor if your symptoms do not get better or if they get worse. Visit your doctor or health care professional for regular checks on your progress. Because it may   take several weeks to see the full effects of this medicine, it is important to continue your treatment as prescribed by your doctor. Patients  and their families should watch out for new or worsening thoughts of suicide or depression. Also watch out for sudden changes in feelings such as feeling anxious, agitated, panicky, irritable, hostile, aggressive, impulsive, severely restless, overly excited and hyperactive, or not being able to sleep. If this happens, especially at the beginning of treatment or after a change in dose, call your health care professional. You may get drowsy or dizzy. Do not drive, use machinery, or do anything that needs mental alertness until you know how this medicine affects you. Do not stand or sit up quickly, especially if you are an older patient. This reduces the risk of dizzy or fainting spells. Alcohol may interfere with the effect of this medicine. Avoid alcoholic drinks. Your mouth may get dry. Chewing sugarless gum or sucking hard candy, and drinking plenty of water may help. Contact your doctor if the problem does not go away or is severe. What side effects may I notice from receiving this medicine? Side effects that you should report to your doctor or health care professional as soon as possible:  allergic reactions like skin rash, itching or hives, swelling of the face, lips, or tongue  anxious  black, tarry stools  changes in vision  confusion  elevated mood, decreased need for sleep, racing thoughts, impulsive behavior  eye pain  fast, irregular heartbeat  feeling faint or lightheaded, falls  feeling agitated, angry, or irritable  hallucination, loss of contact with reality  loss of balance or coordination  loss of memory  painful or prolonged erections  restlessness, pacing, inability to keep still  seizures  stiff muscles  suicidal thoughts or other mood changes  trouble sleeping  unusual bleeding or bruising  unusually weak or tired  vomiting Side effects that usually do not require medical attention (report to your doctor or health care professional if they  continue or are bothersome):  changes in appetite  change in sex drive or performance  headache  increased sweating  indigestion, nausea  tremors This list may not describe all possible side effects. Call your doctor for medical advice about side effects. You may report side effects to FDA at 1-800-FDA-1088. Where should I keep my medicine? Keep out of reach of children. Store at room temperature between 15 and 30 degrees C (59 and 86 degrees F). Throw away any unused medicine after the expiration date. NOTE: This sheet is a summary. It may not cover all possible information. If you have questions about this medicine, talk to your doctor, pharmacist, or health care provider.  2020 Elsevier/Gold Standard (2018-10-02 11:21:44)  

## 2020-09-08 NOTE — Progress Notes (Signed)
Virtual Visit via Telephone Note  I connected with [name] on 09/08/20 at 11:15 AM EST by telephone and verified that I am speaking with the correct person using two identifiers.  Location:  Patient: Julia Chandler (home)  Provider: Serafina Royals, CNM ([facility], [facility])   I discussed the limitations, risks, security and privacy concerns of performing an evaluation and management service by telephone and the availability of in person appointments. I also discussed with the patient that there may be a patient responsible charge related to this service. The patient expressed understanding and agreed to proceed.   History of Present Illness:  Last visit on 07/31/2020 for evaluation of anxiety and depression; for further details, please see previous note.   Patient states medication is working; however, she still notices decreased motivation.   Still seeking counseling services. No SI/HI.   Observations/Objective:  Depression screen [facility] 2/9 09/08/2020 07/31/2020  Decreased Interest 1 3  Down, Depressed, Hopeless 1 3  PHQ - 2 Score 2 6  Altered sleeping 1 3  Tired, decreased energy 1 3  Change in appetite 1 3  Feeling bad or failure about yourself  1 3  Trouble concentrating 0 3  Moving slowly or fidgety/restless 0 3  Suicidal thoughts 0 3  PHQ-9 Score 6 27  Difficult doing work/chores - Extremely dIfficult   GAD 7 : Generalized Anxiety Score 09/08/2020 07/31/2020  Nervous, Anxious, on Edge 0 3  Control/stop worrying 0 3  Worry too much - different things 0 3  Trouble relaxing 1 3  Restless 0 0  Easily annoyed or irritable 1 3  Afraid - awful might happen 0 3  Total GAD 7 Score 2 18  Anxiety Difficulty - Extremely difficult     Assessment:  Anxiety and depression  History of postpartum depression  Plan:  Rx Lexapro, see orders.   Reviewed red flag symptoms and when to call.   RTC if symptoms worsen or fail to improve.  Referral to Behavior  Health, see orders.   Follow Up Instructions:    I discussed the assessment and treatment plan with the patient. The patient was provided an opportunity to ask questions and all were answered. The patient agreed with the plan and demonstrated an understanding of the instructions.   The patient was advised to call back or seek an in-person evaluation if the symptoms worsen or if the condition fails to improve as anticipated.  I provided 8 minutes of non-face-to-face time during this encounter.   Serafina Royals, CNM  [facility], [facility] 09/08/20 12:02 PM

## 2021-02-26 ENCOUNTER — Other Ambulatory Visit: Payer: Self-pay

## 2021-02-26 ENCOUNTER — Other Ambulatory Visit (HOSPITAL_COMMUNITY)
Admission: RE | Admit: 2021-02-26 | Discharge: 2021-02-26 | Disposition: A | Discharge status: Home / Self Care | Payer: Self-pay | Source: Ambulatory Visit | Attending: Certified Nurse Midwife | Admitting: Certified Nurse Midwife

## 2021-02-26 ENCOUNTER — Ambulatory Visit (INDEPENDENT_AMBULATORY_CARE_PROVIDER_SITE_OTHER): Payer: Self-pay | Admitting: Certified Nurse Midwife

## 2021-02-26 VITALS — BP 111/76 | HR 88 | Ht 66.0 in | Wt 154.2 lb

## 2021-02-26 DIAGNOSIS — N8189 Other female genital prolapse: Secondary | ICD-10-CM

## 2021-02-26 DIAGNOSIS — R1084 Generalized abdominal pain: Secondary | ICD-10-CM

## 2021-02-26 DIAGNOSIS — N898 Other specified noninflammatory disorders of vagina: Secondary | ICD-10-CM | POA: Insufficient documentation

## 2021-02-26 DIAGNOSIS — Z8742 Personal history of other diseases of the female genital tract: Secondary | ICD-10-CM

## 2021-02-26 NOTE — Progress Notes (Signed)
GYN ENCOUNTER NOTE  Subjective:       Julia Chandler is a 35 y.o. G41P2012 female is here for gynecologic evaluation of the following issues:  1. Concerned for prolapse, reports pain with sex and the feeling that "something is coming out of her vagina" 2. Reports recurrent vaginal yeast infections, unresolved with use of Diflucan 3. Notes lower abdominal pain after eating, so patient doesn't eat much to avoid the symptoms  Denies difficulty breathing or respiratory distress, chest pain, abdominal pain, excessive vaginal bleeding, dysuria, and leg pain or swelling.   History ovarian cyst.    Gynecologic History  Patient's last menstrual period was 02/08/2021 (exact date).  Contraception: coitus interruptus  Last Pap: 03/2017. Results were: Neg/Neg  Obstetric History  OB History  Gravida Para Term Preterm AB Living  3 2 2   1 2   SAB IAB Ectopic Multiple Live Births  1     0 2    # Outcome Date GA Lbr Len/2nd Weight Sex Delivery Anes PTL Lv  3 Term 11/03/16 [redacted]w[redacted]d / 00:26 7 lb 6.9 oz (3.37 kg) M Vag-Spont EPI  LIV  2 Term 02/08/14 [redacted]w[redacted]d 12:30 / 02:34 6 lb 13.7 oz (3.11 kg) F Vag-Vacuum EPI  LIV  1 SAB 08/11/11            Past Medical History:  Diagnosis Date  . Headache(784.0)   . Ovarian cyst   . Preterm labor   . Vaginal Pap smear, abnormal    f/u ok    Past Surgical History:  Procedure Laterality Date  . WISDOM TOOTH EXTRACTION      Current Outpatient Medications on File Prior to Visit  Medication Sig Dispense Refill  . Cholecalciferol (VITAMIN D3) 5000 units CAPS Take 1 capsule (5,000 Units total) by mouth daily. 120 capsule 2  . escitalopram (LEXAPRO) 10 MG tablet Take 1 tablet (10 mg total) by mouth daily. 30 tablet 3   No current facility-administered medications on file prior to visit.    Allergies  Allergen Reactions  . Vicodin [Hydrocodone-Acetaminophen] Rash  . Percocet [Oxycodone-Acetaminophen] Rash    Social History   Socioeconomic History  .  Marital status: Married    Spouse name: Not on file  . Number of children: Not on file  . Years of education: Not on file  . Highest education level: Not on file  Occupational History  . Not on file  Tobacco Use  . Smoking status: Never Smoker  . Smokeless tobacco: Never Used  Vaping Use  . Vaping Use: Never used  Substance and Sexual Activity  . Alcohol use: Yes    Comment: occasionally  . Drug use: No  . Sexual activity: Yes    Birth control/protection: Coitus interruptus  Other Topics Concern  . Not on file  Social History Narrative  . Not on file   Social Determinants of Health   Financial Resource Strain: Not on file  Food Insecurity: Not on file  Transportation Needs: Not on file  Physical Activity: Not on file  Stress: Not on file  Social Connections: Not on file  Intimate Partner Violence: Not on file    Family History  Problem Relation Age of Onset  . Cancer Mother   . Miscarriages / 08/13/11 Mother   . Hypertension Father   . Anxiety disorder Sister   . Thyroid disease Maternal Grandmother   . Cancer Maternal Grandfather   . Heart disease Paternal Grandmother   . Hypertension Paternal Grandmother   .  Heart disease Paternal Grandfather   . Hypertension Paternal Grandfather     The following portions of the patient's history were reviewed and updated as appropriate: allergies, current medications, past family history, past medical history, past social history, past surgical history and problem list.  Review of Systems  ROS negative except as noted above. Information obtained from patient.   Objective:   BP 111/76   Pulse 88   Ht 5\' 6"  (1.676 m)   Wt 154 lb 3.2 oz (69.9 kg)   LMP 02/04/2021   BMI 24.89 kg/m   CONSTITUTIONAL: Well-developed, well-nourished female in no acute distress.   ABDOMEN: Soft, non distended; Non tender.  No Organomegaly.  PELVIC:  External Genitalia: Normal  Cervix: At introitus  Uterus: Normal size except  prolapsed  Adnexa: Normal   MUSCULOSKELETAL: Normal range of motion. No tenderness.  No cyanosis, clubbing, or edema.  Assessment:   1. Vaginal itching  - Cervicovaginal ancillary only  2. Vaginal discharge  - Cervicovaginal ancillary only  3. Generalized postprandial abdominal pain  - Ambulatory referral to Physical Therapy - Ambulatory referral to Gastroenterology  4. History of ovarian cyst   5. Other female genital prolapse  - Ambulatory referral to Physical Therapy    Plan:   Vaginal swab collected, see orders.   Discussed need for weekly Diflucan use to decolonize yeast; patient verbalized understanding.  Referral to Pelvic Floor Physical Therapy and GI, see orders.    Pelvic ultrasound ordered, see chart.   Reviewed red flag symptoms and when to call  RTC as indicated.    02/06/2021, CNM [facility], [facility] 02/27/21 10:45 AM

## 2021-02-26 NOTE — Patient Instructions (Addendum)
Pelvic Organ Prolapse Pelvic organ prolapse is a condition in women that involves the stretching, bulging, or dropping of pelvic organs into an abnormal position, past the opening of the vagina. It happens when the muscles and tissues that surround and support pelvic structures become weak or stretched. Pelvic organ prolapse can involve the:  Vagina (vaginal prolapse).  Uterus (uterine prolapse).  Bladder (cystocele).  Rectum (rectocele).  Intestines (enterocele). When organs other than the vagina are involved, they often bulge into the vagina or protrude from the vagina, depending on how severe the prolapse is. What are the causes? This condition may be caused by:  Pregnancy, labor, and childbirth.  Past pelvic surgery.  Lower levels of the hormone estrogen due to menopause.  Consistently lifting more than 50 lb (23 kg).  Obesity.  Long-term difficulty passing stool (chronic constipation).  Long-term, or chronic, cough.  Fluid buildup in the abdomen due to certain conditions. What are the signs or symptoms? Symptoms of this condition include:  Leaking a little urine (loss of bladder control) when you cough, sneeze, strain, and exercise (stress incontinence). This may be worse immediately after childbirth. It may gradually improve over time.  Feeling pressure in your pelvis or vagina. This pressure may increase when you cough or when you are passing stool.  A bulge that protrudes from the opening of your vagina.  Difficulty passing urine or stool.  Pain in your lower back.  Pain or discomfort during sex, or decreased interest in sex.  Repeated bladder infections (urinary tract infections).  Difficulty inserting a tampon. In some people, this condition causes no symptoms. How is this diagnosed? This condition may be diagnosed based on a vaginal and rectal exam. During the exam, you may be asked to cough and strain while you are lying down, sitting, and standing up.  Your health care provider will determine if other tests are required, such as bladder function tests. How is this treated? Treatment for this condition may depend on your symptoms. Treatment may include:  Lifestyle changes, such as drinking plenty of fluids and eating foods that are high in fiber.  Emptying your bladder at scheduled times (bladder training therapy). This can help reduce or avoid urinary incontinence.  Estrogen. This may help mild prolapse by increasing the strength and tone of pelvic floor muscles.  Kegel exercises. These may help mild cases of prolapse by strengthening and tightening the muscles of the pelvic floor.  A soft, flexible device that helps support the vaginal walls and keep pelvic organs in place (pessary). This is inserted into your vagina by your health care provider.  Surgery. This is often the only form of treatment for severe prolapse. Follow these instructions at home: Eating and drinking  Avoid drinking beverages that contain caffeine or alcohol.  Increase your intake of high-fiber foods to decrease constipation and straining during bowel movements. Activity  Lose weight if recommended by your health care provider.  Avoid heavy lifting and straining with exercise and work. Do not hold your breath when you perform mild to moderate lifting and exercise activities. Limit your activities as directed by your health care provider.  Do Kegel exercises as directed by your health care provider. To do this: ? Squeeze your pelvic floor muscles tight. You should feel a tight lift in your rectal area and a tightness in your vaginal area. Keep your stomach, buttocks, and legs relaxed. ? Hold the muscles tight for up to 10 seconds. Then relax your muscles. ? Repeat this exercise   50 times a day, or as much as told by your health care provider. Continue to do this exercise for at least 4-6 weeks, or for as long as told by your health care provider. General  instructions  Take over-the-counter and prescription medicines only as told by your health care provider.  Wear a sanitary pad or adult diapers if you have urinary incontinence.  If you have a pessary, take care of it as told by your health care provider.  Keep all follow-up visits. This is important. Contact a health care provider if you:  Have symptoms that interfere with your daily activities or sex life.  Need medicine to help with the discomfort.  Notice bleeding from your vagina that is not related to your menstrual period.  Have a fever.  Have pain or bleeding when you urinate.  Have bleeding when you pass stool.  Pass urine when you have sex.  Have chronic constipation.  Have a pessary that falls out.  Have a foul-smelling vaginal discharge.  Have an unusual, low pain in your abdomen. Get help right away if you:  Cannot pass urine. Summary  Pelvic organ prolapse is the stretching, bulging, or dropping of pelvic organs into an abnormal position. It happens when the muscles and tissues that surround and support pelvic structures become weak or stretched.  When organs other than the vagina are involved, they often bulge into the vagina or protrude from it, depending on how severe the prolapse is.  In most cases, this condition needs to be treated only if it produces symptoms. Treatment may include lifestyle changes, estrogen, Kegel exercises, pessary insertion, or surgery.  Avoid heavy lifting and straining with exercise and work. Do not hold your breath when you perform mild to moderate lifting and exercise activities. Limit your activities as directed by your health care provider. This information is not intended to replace advice given to you by your health care provider. Make sure you discuss any questions you have with your health care provider. Document Revised: 04/07/2020 Document Reviewed: 04/07/2020 Elsevier Patient Education  2021 Elsevier  Inc.   Vaginitis  Vaginitis is irritation and swelling of the vagina. Treatment will depend on the cause. What are the causes? It can be caused by:  Bacteria.  Yeast.  A parasite.  A virus.  Low hormone levels.  Bubble baths, scented tampons, and feminine sprays. Other things can change the balance of the yeast and bacteria that live in the vagina. These include:  Antibiotic medicines.  Not being clean enough.  Some birth control methods.  Sex.  Infection.  Diabetes.  A weakened body defense system (immune system). What increases the risk?  Smoking or being around someone who smokes.  Using washes (douches), scented tampons, or scented pads.  Wearing tight pants or thong underwear.  Using birth control pills or an IUD.  Having sex without a condom or having a lot of partners.  Having an STI.  Using a certain product to kill sperm (nonoxynol-9).  Eating foods that are high in sugar.  Having diabetes.  Having low levels of a female hormone.  Having a weakened body defense system.  Being pregnant or breastfeeding. What are the signs or symptoms?  Fluid coming from the vagina that is not normal.  A bad smell.  Itching, pain, or swelling.  Pain with sex.  Pain or burning when you pee (urinate). Sometimes there are no symptoms. How is this treated? Treatment may include:  Antibiotic creams or pills.  Antifungal medicines.  Medicines to ease symptoms if you have a virus. Your sex partner should also be treated.  Estrogen medicines.  Avoiding scented soaps, sprays, or douches.  Stopping use of products that caused irritation and then using a cream to treat symptoms. Follow these instructions at home: Lifestyle  Keep the area around your vagina clean and dry. ? Avoid using soap. ? Rinse the area with water.  Until your doctor says it is okay: ? Do not use washes for the vagina. ? Do not use tampons. ? Do not have sex.  Wipe from  front to back after going to the bathroom.  When your doctor says it is okay, practice safe sex and use condoms. General instructions  Take over-the-counter and prescription medicines only as told by your doctor.  If you were prescribed an antibiotic medicine, take or use it as told by your doctor. Do not stop taking or using it even if you start to feel better.  Keep all follow-up visits. How is this prevented?  Do not use things that can irritate the vagina, such as fabric softeners. Avoid these products if they are scented: ? Sprays. ? Detergents. ? Tampons. ? Products for cleaning the vagina. ? Soaps or bubble baths.  Let air reach your vagina. To do this: ? Wear cotton underwear. ? Do not wear:  Underwear while you sleep.  Tight pants.  Thong underwear.  Underwear or nylons without a cotton panel. ? Take off any wet clothing, such as bathing suits, as soon as you can. ? Practice safe sex and use condoms. Contact a doctor if:  You have pain in your belly or in the area between your hips.  You have a fever or chills.  Your symptoms last for more than 2-3 days. Get help right away if:  You have a fever and your symptoms get worse all of a sudden. Summary  Vaginitis is irritation and swelling of the vagina.  Treatment will depend on the cause of the condition.  Do not use washes or tampons or have sex until your doctor says it is okay. This information is not intended to replace advice given to you by your health care provider. Make sure you discuss any questions you have with your health care provider. Document Revised: 04/10/2020 Document Reviewed: 04/10/2020 Elsevier Patient Education  2021 Elsevier Inc.   Abdominal Pain, Adult Many things can cause belly (abdominal) pain. Most times, belly pain is not dangerous. Many cases of belly pain can be watched and treated at home. Sometimes, though, belly pain is serious. Your doctor will try to find the cause of  your belly pain. Follow these instructions at home: Medicines  Take over-the-counter and prescription medicines only as told by your doctor.  Do not take medicines that help you poop (laxatives) unless told by your doctor. General instructions  Watch your belly pain for any changes.  Drink enough fluid to keep your pee (urine) pale yellow.  Keep all follow-up visits as told by your doctor. This is important.   Contact a doctor if:  Your belly pain changes or gets worse.  You are not hungry, or you lose weight without trying.  You are having trouble pooping (constipated) or have watery poop (diarrhea) for more than 2-3 days.  You have pain when you pee or poop.  Your belly pain wakes you up at night.  Your pain gets worse with meals, after eating, or with certain foods.  You are vomiting and  cannot keep anything down.  You have a fever.  You have blood in your pee. Get help right away if:  Your pain does not go away as soon as your doctor says it should.  You cannot stop vomiting.  Your pain is only in areas of your belly, such as the right side or the left lower part of the belly.  You have bloody or black poop, or poop that looks like tar.  You have very bad pain, cramping, or bloating in your belly.  You have signs of not having enough fluid or water in your body (dehydration), such as: ? Dark pee, very little pee, or no pee. ? Cracked lips. ? Dry mouth. ? Sunken eyes. ? Sleepiness. ? Weakness.  You have trouble breathing or chest pain. Summary  Many cases of belly pain can be watched and treated at home.  Watch your belly pain for any changes.  Take over-the-counter and prescription medicines only as told by your doctor.  Contact a doctor if your belly pain changes or gets worse.  Get help right away if you have very bad pain, cramping, or bloating in your belly. This information is not intended to replace advice given to you by your health care  provider. Make sure you discuss any questions you have with your health care provider. Document Revised: 02/19/2019 Document Reviewed: 02/19/2019 Elsevier Patient Education  2021 [employer].

## 2021-02-27 LAB — CERVICOVAGINAL ANCILLARY ONLY
Bacterial Vaginitis (gardnerella): NEGATIVE
Candida Glabrata: NEGATIVE
Candida Vaginitis: NEGATIVE
Comment: NEGATIVE
Comment: NEGATIVE
Comment: NEGATIVE

## 2021-03-03 ENCOUNTER — Encounter: Payer: Self-pay | Admitting: *Deleted

## 2021-03-24 ENCOUNTER — Other Ambulatory Visit: Payer: Self-pay

## 2021-03-24 ENCOUNTER — Ambulatory Visit: Payer: 59 | Attending: Certified Nurse Midwife | Admitting: Physical Therapy

## 2021-03-24 ENCOUNTER — Encounter: Payer: Self-pay | Admitting: Physical Therapy

## 2021-03-24 DIAGNOSIS — R293 Abnormal posture: Secondary | ICD-10-CM | POA: Insufficient documentation

## 2021-03-24 DIAGNOSIS — R278 Other lack of coordination: Secondary | ICD-10-CM | POA: Diagnosis present

## 2021-03-24 DIAGNOSIS — M6281 Muscle weakness (generalized): Secondary | ICD-10-CM | POA: Insufficient documentation

## 2021-03-24 NOTE — Therapy (Signed)
[facility] [address] [place], [place], [address] Phone: [phone]   Fax:  [phone]  Physical Therapy Evaluation  Patient Details  Name: Julia Chandler MRN: [id number] Date of Birth: August 31, 1986 Referring Provider (PT): Barbie Haggis   Encounter Date: 03/24/2021   PT End of Session - 03/24/21 1711    Visit Number 1    Number of Visits 12    Date for PT Re-Evaluation 06/16/21    Authorization Type IE 03/24/2021    PT Start Time 1555    PT Stop Time 1640    PT Time Calculation (min) 45 min    Activity Tolerance Patient tolerated treatment well    Behavior During Therapy [facility] for tasks assessed/performed           Past Medical History:  Diagnosis Date  . Headache(784.0)   . Ovarian cyst   . Preterm labor   . Vaginal Pap smear, abnormal    f/u ok    Past Surgical History:  Procedure Laterality Date  . WISDOM TOOTH EXTRACTION      There were no vitals filed for this visit.        [facility] PT Assessment - 03/24/21 0001      Assessment   Medical Diagnosis Postprandial abdominal pain; other female genital prolapse    Referring Provider (PT) JM Lawhorn    Hand Dominance Right    Prior Therapy None for this dx      Balance Screen   Has the patient fallen in the past 6 months No            PELVIC HEALTH PHYSICAL THERAPY EVALUATION  SCREENING Red Flags:  Have you had any night sweats? Yes in the last 2-3 months, 3-4 nights/week. Unexplained weight loss? Avoids food because of severe abdominal pain. Saddle anesthesia? No. Unexplained changes in bowel or bladder habits? No.   SUBJECTIVE  Chief Complaint: Patient has severe postprandial pain in the abdomen. Patient has had this pain since August 2021. Patient notes longstanding history of constipation. Patient also notes that she has pelvic organ prolapse which has progressed. Patient notes that she would like to be able to provide care for her children and  participate in sexual activity with her husband. Patient has decreased urge to urinate; patient also notes that she has incomplete emptying about 75% of the time. Patient notes history of severe back pain which she notes has transferred to the front.  Pertinent History:  Falls Negative.  Scoliosis Negative. Pulmonary disease/dysfunction Negative. Surgical history: Positive for wisdom teeth extraction.   Recent Procedures/Tests/Findings: [place] 03/26/2021  Obstetrical History: G3P2 Deliveries: vaginal Tearing/Episiotomy: P1 forceps, grade 1-2 tear; P2 tear grade 1-2 Birthing position: back  Gynecological History: Hysterectomy: No  Endometriosis: Negative Pelvic Organ Prolapse: Positive Last Menstrual Period: 03/08/2021 (estimate) Pain with exam: Yes Heaviness/pressure: Yes    Urinary History: Incontinence: Positive. Onset: 4 years ago Triggers: coughing/sneezing. Amount: Min/Mod. Protective undergarments: No   Fluid Intake: 4-6x 24 oz H20, avoid caffeinated, 1x juices/sodas Nocturia: 1x/night Frequency of urination: every 2 hours Toileting posture: feet flat Pain with urination: Negative Difficulty initiating urination: Positive Intermittent stream: Negative Frequent UTI: Negative.   Gastrointestinal History: [place] Stool Chart: Type 3-4 Frequency of BMs: 2-4x/week Pain with defecation: Positive for abdominal pain. Straining with defecation: Positive for history of Hemorrhoids: Positive; external; latent Toileting posture: squatty potty. Incontinence: Negative.   Sexual activity/pain: Pain with intercourse: Positive.   Initial penetration: Yes  Deep thrusting: Yes  External stimulation: Yes Able to achieve orgasm: No  Location of pain: abdominal (mid and lower) Current pain:  2-3/10  Max pain:  8/10 (end of night, if a couple meals have been eaten) Least pain:  0/10 Pain quality: pain quality: cramping and contraction type pain Radiating pain: No  Location of pain:  vaginal Current pain:  2-3/10  Max pain:  7-8/10 Least pain:  0/10 Pain quality: pain quality: aching and dull Radiating pain: No    OBJECTIVE  Mental Status Patient is oriented to person, place and time.  Recent memory is intact.  Remote memory is intact.  Attention span and concentration are intact.  Expressive speech is intact.  Patient's fund of knowledge is within normal limits for educational level.  POSTURE/OBSERVATIONS:  Lumbar lordosis: diminished Thoracic kyphosis: increased Iliac crest height: appearing equal bilaterally Patient assumes hip adducted position in sitting.  GAIT: Posterior pelvic tilt with ER B.   RANGE OF MOTION: deferred 2/2 to time constraints   LEFT RIGHT  Lumbar forward flexion (65):      Lumbar extension (30):     Lumbar lateral flexion (25):     Thoracic and Lumbar rotation (30 degrees):       Hip Flexion (0-125):      Hip IR (0-45):     Hip ER (0-45):     Hip Abduction (0-40):     Hip extension (0-15):       STRENGTH: MMT deferred 2/2 to time constraints  RLE LLE  Hip Flexion    Hip Extension    Hip Abduction     Hip Adduction     Hip ER     Hip IR     Knee Extension    Knee Flexion    Dorsiflexion     Plantarflexion (seated)     ABDOMINAL: deferred 2/2 to time constraints Palpation: Diastasis: Scar mobility: Rib flare:  SPECIAL TESTS: deferred 2/2 to time constraints  PHYSICAL PERFORMANCE MEASURES: STS: WNL   EXTERNAL PELVIC EXAM: deferred 2/2 to time constraints Palpation: Breath coordination: Cued Lengthen: Cued Contraction: Cough:  INTERNAL VAGINAL EXAM: deferred 2/2 to time constraints Introitus Appears:  Skin integrity:  Scar mobility: Strength (PERF):  Symmetry: Palpation: Prolapse:   INTERNAL RECTAL EXAM: not indicated Strength (PERF): Symmetry: Palpation: Prolapse:   OUTCOME MEASURES: FOTO (Urinary 59, Bowel 49)   ASSESSMENT Patient is a 35 year old presenting to clinic with chief  complaints of pelvic pain, abdominal pain, incomplete bladder emptying, and pelvic organ prolapse. Today's evaluation was limited 2/2 to late arrival. Patient's evaluation is suggestive of deficits in pelvic organ support, pain, PFM coordination, PFM strength as evidenced by difficulty initiating urination, SUI, straining with defecation, pain with defecation, pain with digestion, pain with sexual activity. Of particular concern is patient's report of red flag symptoms including unexplained night sweats for the past 2-3 months and postprandial 8/10 pain. Patient also restricts food to avoid postprandial pain. Patient's responses on FOTO outcome measures (Urinary 59, Bowel 49) indicate significant functional limitations/disability/distress. Patient's progress may be limited due to her current pelvic organ position as noted "to the introitus" in MD note and per patient occasionally "through"; however, patient's motivation is advantageous. At this time, PT will proceed cautiously and conservatively until further evaluation by gynecology and gastroenterology. Patient will benefit from continued skilled therapeutic intervention to address deficits in pelvic organ support, pain, PFM coordination, PFM strength in order to increase function and improve overall QOL.  EDUCATION Patient educated on prognosis POC.Marland Kitchen  Patient articulated understanding and returned demonstration. Patient will benefit from further education in order to maximize compliance and understanding for long-term therapeutic gains.    Objective measurements completed on examination: See above findings.      Plan - 03/24/21 1658    Clinical Impression Statement Patient is a 35 year old presenting to clinic with chief complaints of pelvic pain, abdominal pain, incomplete bladder emptying, and pelvic organ prolapse. Today's evaluation was limited 2/2 to late arrival. Patient's evaluation is suggestive of deficits in pelvic organ support, pain, PFM  coordination, PFM strength as evidenced by difficulty initiating urination, SUI, straining with defecation, pain with defecation, pain with digestion, pain with sexual activity. Of particular concern is patient's report of red flag symptoms including unexplained night sweats for the past 2-3 months and postprandial 8/10 pain. Patient also restricts food to avoid postprandial pain. Patient's responses on FOTO outcome measures (Urinary 59, Bowel 49) indicate significant functional limitations/disability/distress. Patient's progress may be limited due to her current pelvic organ position as noted "to the introitus" in MD note and per patient occasionally "through"; however, patient's motivation is advantageous. At this time, PT will proceed cautiously and conservatively until further evaluation by gynecology and gastroenterology. Patient will benefit from continued skilled therapeutic intervention to address deficits in pelvic organ support, pain, PFM coordination, PFM strength in order to increase function and improve overall QOL.    Personal Factors and Comorbidities Behavior Pattern;Comorbidity 3+;Fitness;Past/Current Experience;Time since onset of injury/illness/exacerbation    Comorbidities headache, history of ovarian cyst, anxiety and depression,    Examination-Activity Limitations Locomotion Level;Transfers;Reach Overhead;Squat;Sleep;Continence;Stairs;Stand;Lift;Bend    Examination-Participation Restrictions Cleaning;Occupation;Meal Prep;Community Activity;Laundry;Yard Work;Interpersonal Relationship    Stability/Clinical Decision Making Evolving/Moderate complexity    Clinical Decision Making Moderate    Rehab Potential Fair    PT Frequency 1x / week    PT Duration 12 weeks    PT Treatment/Interventions ADLs/Self Care Home Management;Cryotherapy;Electrical Stimulation;Moist Heat;Therapeutic exercise;Neuromuscular re-education;Patient/family education;Orthotic Fit/Training;Manual techniques;Scar  mobilization;Compression bandaging;Dry needling;Taping;Spinal Manipulations;Joint Manipulations    Consulted and Agree with Plan of Care Patient           Patient will benefit from skilled therapeutic intervention in order to improve the following deficits and impairments:  Decreased coordination,Decreased endurance,Decreased activity tolerance,Pain,Improper body mechanics,Postural dysfunction,Decreased strength  Visit Diagnosis: Muscle weakness (generalized)  Other lack of coordination  Abnormal posture     Problem List There are no problems to display for this patient.  Sheria Lang PT, DPT [phone]  03/24/2021, 5:14 PM  [facility] [address] [place], [place], [address] Phone: [phone]   Fax:  [phone]  Name: Julia Chandler MRN: [id number] Date of Birth: 18-Jan-1986

## 2021-03-26 ENCOUNTER — Ambulatory Visit
Admission: RE | Admit: 2021-03-26 | Discharge: 2021-03-26 | Disposition: A | Discharge status: Home / Self Care | Payer: 59 | Source: Ambulatory Visit | Attending: Certified Nurse Midwife | Admitting: Certified Nurse Midwife

## 2021-03-26 ENCOUNTER — Other Ambulatory Visit: Payer: Self-pay

## 2021-03-26 DIAGNOSIS — Z8742 Personal history of other diseases of the female genital tract: Secondary | ICD-10-CM | POA: Insufficient documentation

## 2021-03-26 DIAGNOSIS — R1084 Generalized abdominal pain: Secondary | ICD-10-CM | POA: Insufficient documentation

## 2021-03-27 NOTE — Telephone Encounter (Signed)
Please see patient request. Thanks, JML

## 2021-03-30 ENCOUNTER — Ambulatory Visit: Payer: 59 | Admitting: Physical Therapy

## 2021-04-01 ENCOUNTER — Encounter: Payer: Self-pay | Admitting: Physical Therapy

## 2021-04-01 ENCOUNTER — Other Ambulatory Visit: Payer: Self-pay

## 2021-04-01 ENCOUNTER — Ambulatory Visit: Payer: 59 | Attending: Certified Nurse Midwife | Admitting: Physical Therapy

## 2021-04-01 DIAGNOSIS — M6281 Muscle weakness (generalized): Secondary | ICD-10-CM | POA: Insufficient documentation

## 2021-04-01 DIAGNOSIS — R278 Other lack of coordination: Secondary | ICD-10-CM | POA: Insufficient documentation

## 2021-04-01 DIAGNOSIS — R293 Abnormal posture: Secondary | ICD-10-CM | POA: Insufficient documentation

## 2021-04-01 NOTE — Therapy (Signed)
[facility] [address] [place], [place], [address] Phone: [phone]   Fax:  [phone]  Physical Therapy Treatment  Patient Details  Name: Julia Chandler MRN: [id number] Date of Birth: Jan 12, 1986 Referring Provider (PT): Barbie Haggis   Encounter Date: 04/01/2021   PT End of Session - 04/01/21 1640    Visit Number 2    Number of Visits 12    Date for PT Re-Evaluation 06/16/21    Authorization Type IE 03/24/2021    PT Start Time 1630    PT Stop Time 1710    PT Time Calculation (min) 40 min    Activity Tolerance Patient tolerated treatment well    Behavior During Therapy [facility] for tasks assessed/performed           Past Medical History:  Diagnosis Date  . Headache(784.0)   . Ovarian cyst   . Preterm labor   . Vaginal Pap smear, abnormal    f/u ok    Past Surgical History:  Procedure Laterality Date  . WISDOM TOOTH EXTRACTION      There were no vitals filed for this visit.   Subjective Assessment - 04/01/21 1635    Subjective Patient had [place] and results showed benign results. Patient notes that she has been elevating hips regularly and itching sensations have been relieved about 95%. Patient notes that she had residual pain after [place] from increased bladder storage. Patient notes that she does occasional numbness in a band around the lower abdomen and back after poor sleep.    Currently in Pain? Yes    Pain Score 1     Pain Location Vagina    Pain Descriptors / Indicators Pressure           TREATMENT  Pre-treatment assessment: RANGE OF MOTION:    LEFT RIGHT  Lumbar forward flexion (65):  WNL    Lumbar extension (30): WNL    Lumbar lateral flexion (25):  WNL WNL  Thoracic and Lumbar rotation (30 degrees):    WNL WNL  Hip Flexion (0-125):   WNL WNL  Hip IR (0-45):  WNL WNL  Hip ER (0-45):  WNL WNL  Hip Abduction (0-40):  WNL WNL  Hip extension (0-15):  WNL WNL    SENSATION: Grossly intact to light touch  bilateral LEs as determined by testing dermatomes L2-S2 Proprioception and hot/cold testing deferred on this date  STRENGTH: MMT   RLE LLE  Hip Flexion 5 5  Hip Extension 5 5  Hip Abduction  5 5  Hip Adduction  5 5  Hip ER  5 5  Hip IR  5 5  Knee Extension 5 5  Knee Flexion 5 5  Dorsiflexion  5 5  Plantarflexion (seated) 5 5   ABDOMINAL:  Palpation: mild TTP over UQ (predominantly midline) Diastasis: 3 finger width at umbilicus, < 1 finger superiorly and inferiorly Scar mobility: n/a Rib flare: mildly present on L  SPECIAL TESTS: Stork/March (SP 93): R: Negative L: Negative  EXTERNAL PELVIC EXAM: Patient educated on the purpose of the pelvic exam and articulated understanding; patient consented to the exam verbally. Breath coordination: present Cued Lengthen: present Cued Contraction: 3/5 MMT, significant hip adductor fasciculations  Cough: paradoxical  Neuromuscular Re-education: Supine hooklying diaphragmatic breathing with VCs and TCs for downregulation of the nervous system and improved management of IAP Supine hooklying, TrA activation with exhalation and DRAM compression. VCs and TCs to decrease compensatory patterns and minimize aggravation of the lumbar paraspinals.  Patient educated throughout session on appropriate technique and form using multi-modal cueing, HEP, and activity modification. Patient articulated understanding and returned demonstration.  Patient Response to interventions: Comfortable to return in 1 week for PFM strengthening  ASSESSMENT Patient presents to clinic with excellent motivation to participate in therapy. Patient demonstrates deficits in pelvic organ support, pain, PFM coordination, PFM strength . Patient able to achieve good TrA activation with coordinated breath pattern during today's session and responded positively to active and educational interventions. Patient will benefit from continued skilled therapeutic intervention to address  remaining deficits in pelvic organ support, pain, PFM coordination, PFM strength in order to increase function and improve overall QOL.     PT Long Term Goals - 03/24/21 1708      PT LONG TERM GOAL #1   Title Patient will demonstrate independence with HEP in order to maximize therapeutic gains and improve carryover from physical therapy sessions to ADLs in the home and community.    Baseline IE: not initiated    Time 12    Period Weeks    Status New    Target Date 06/16/21      PT LONG TERM GOAL #2   Title Patient will demonstrate improved function as evidenced by a score of 68 on FOTO Urinary measure for full participation in activities at home and in the community.    Baseline IE: 59    Time 12    Period Weeks    Status New    Target Date 06/16/21      PT LONG TERM GOAL #3   Title Patient will demonstrate improved function as evidenced by a score of 55 on FOTO Bowel  measure for full participation in activities at home and in the community.    Baseline IE: 49    Time 12    Period Weeks    Status New    Target Date 06/16/21      PT LONG TERM GOAL #4   Title Patient will demonstrate circumferential and sequential contraction of >4/5 MMT, > 6 sec hold x10 and 5 consecutive quick flicks with </= 10 min rest between testing bouts, and relaxation of the PFM coordinated with breath for improved management of intra-abdominal pressure and normal bowel and bladder function without the presence of pain nor incontinence in order to improve participation at home and in the community.    Baseline IE: assessed at next visit    Time 12    Period Weeks    Status New    Target Date 06/16/21      PT LONG TERM GOAL #5   Title Patient will demonstrate coordinated lengthening and relaxation of PFM with diaphragmatic inhalation in order to decrease spasm and allow for unrestricted elimination of urine/feces for improved overall QOL.    Baseline IE: assessed at next visit    Time 12    Period  Weeks    Status New    Target Date 06/16/21                 Plan - 04/01/21 1641    Clinical Impression Statement Patient presents to clinic with excellent motivation to participate in therapy. Patient demonstrates deficits in pelvic organ support, pain, PFM coordination, PFM strength . Patient able to achieve good TrA activation with coordinated breath pattern during today's session and responded positively to active and educational interventions. Patient will benefit from continued skilled therapeutic intervention to address remaining deficits in pelvic organ support, pain, PFM coordination,  PFM strength in order to increase function and improve overall QOL.    Personal Factors and Comorbidities Behavior Pattern;Comorbidity 3+;Fitness;Past/Current Experience;Time since onset of injury/illness/exacerbation    Comorbidities headache, history of ovarian cyst, anxiety and depression,    Examination-Activity Limitations Locomotion Level;Transfers;Reach Overhead;Squat;Sleep;Continence;Stairs;Stand;Lift;Bend    Examination-Participation Restrictions Cleaning;Occupation;Meal Prep;Community Activity;Laundry;Yard Work;Interpersonal Relationship    Stability/Clinical Decision Making Evolving/Moderate complexity    Rehab Potential Fair    PT Frequency 1x / week    PT Duration 12 weeks    PT Treatment/Interventions ADLs/Self Care Home Management;Cryotherapy;Electrical Stimulation;Moist Heat;Therapeutic exercise;Neuromuscular re-education;Patient/family education;Orthotic Fit/Training;Manual techniques;Scar mobilization;Compression bandaging;Dry needling;Taping;Spinal Manipulations;Joint Manipulations    Consulted and Agree with Plan of Care Patient           Patient will benefit from skilled therapeutic intervention in order to improve the following deficits and impairments:  Decreased coordination,Decreased endurance,Decreased activity tolerance,Pain,Improper body mechanics,Postural  dysfunction,Decreased strength  Visit Diagnosis: Muscle weakness (generalized)  Other lack of coordination  Abnormal posture     Problem List There are no problems to display for this patient.  Sheria Lang PT, DPT [phone]  04/01/2021, 6:59 PM  [facility] [address] [place], [place], [address] Phone: [phone]   Fax:  [phone]  Name: Julia Chandler MRN: [id number] Date of Birth: 1985-11-26

## 2021-04-07 ENCOUNTER — Other Ambulatory Visit: Payer: Self-pay

## 2021-04-07 ENCOUNTER — Ambulatory Visit: Payer: 59 | Admitting: Physical Therapy

## 2021-04-07 ENCOUNTER — Encounter: Payer: Self-pay | Admitting: Physical Therapy

## 2021-04-07 DIAGNOSIS — R278 Other lack of coordination: Secondary | ICD-10-CM

## 2021-04-07 DIAGNOSIS — M6281 Muscle weakness (generalized): Secondary | ICD-10-CM | POA: Diagnosis not present

## 2021-04-07 DIAGNOSIS — R293 Abnormal posture: Secondary | ICD-10-CM

## 2021-04-07 NOTE — Therapy (Signed)
[facility] [address] [place], [place], [address] Phone: [phone]   Fax:  [phone]  Physical Therapy Treatment  Patient Details  Name: Julia Chandler MRN: [id number] Date of Birth: 1986/08/13 Referring Provider (PT): Barbie Haggis   Encounter Date: 04/07/2021   PT End of Session - 04/07/21 1551     Visit Number 3    Number of Visits 12    Date for PT Re-Evaluation 06/16/21    Authorization Type IE 03/24/2021    PT Start Time 1548    PT Stop Time 1644    PT Time Calculation (min) 56 min    Activity Tolerance Patient tolerated treatment well    Behavior During Therapy [facility] for tasks assessed/performed             Past Medical History:  Diagnosis Date   Headache(784.0)    Ovarian cyst    Preterm labor    Vaginal Pap smear, abnormal    f/u ok    Past Surgical History:  Procedure Laterality Date   WISDOM TOOTH EXTRACTION      There were no vitals filed for this visit.   Subjective Assessment - 04/07/21 1551     Subjective Patient notes that she had a BM today and was able to empty bladder better. Patient is menstruating and feeling crampy and symptomatic. Patient reports that the burning and itching have remained under control with daily supported elevation of the pelvis above the shoulders.    Currently in Pain? Yes    Pain Score 3     Pain Location Abdomen    Pain Orientation Lower    Pain Descriptors / Indicators Cramping             TREATMENT Manual  Therapy: Colon massage for improved gut motility and decreased abdominal discomfort. Patient educated throughout and demonstrated understanding.  Gentle lymphatic stimulation for improved management of pain and inflammation. Patient educated throughout and demonstrated/articulated understanding.  Neuromuscular Re-education: Supine hooklying diaphragmatic breathing with VCs and TCs for downregulation of the nervous system and improved management of  IAP Supine hooklying, TrA activation with exhalation and DRAM compression. VCs and TCs to decrease compensatory patterns and minimize aggravation of the lumbar paraspinals. [facility] for improved IAP management/postural control Supine heel slides with TrA, coordinated breath Supine marches with TrA, coordinated breath Patient education on importance of deep core with respect to IAP management and signs/symptoms that may suggest the deep core is fatigued/not coordinating effectively.   Patient educated throughout session on appropriate technique and form using multi-modal cueing, HEP, and activity modification. Patient articulated understanding and returned demonstration.  Patient Response to interventions: Comfortable to return in 1 week for PFM strengthening/abdominal progression  ASSESSMENT Patient presents to clinic with excellent motivation to participate in therapy. Patient demonstrates deficits in pelvic organ support, pain, PFM coordination, PFM strength . Patient able to achieve good TrA activation for phase 1 and 2 of [facility] abdominal rehab interventions during today's session and responded positively to manual  and educational interventions. Patient will benefit from continued skilled therapeutic intervention to address remaining deficits in pelvic organ support, pain, PFM coordination, PFM strength in order to increase function and improve overall QOL.    PT Long Term Goals - 03/24/21 1708       PT LONG TERM GOAL #1   Title Patient will demonstrate independence with HEP in order to maximize therapeutic gains and improve carryover from physical therapy sessions to ADLs in  the home and community.    Baseline IE: not initiated    Time 12    Period Weeks    Status New    Target Date 06/16/21      PT LONG TERM GOAL #2   Title Patient will demonstrate improved function as evidenced by a score of 68 on FOTO Urinary measure for full participation in activities  at home and in the community.    Baseline IE: 59    Time 12    Period Weeks    Status New    Target Date 06/16/21      PT LONG TERM GOAL #3   Title Patient will demonstrate improved function as evidenced by a score of 55 on FOTO Bowel  measure for full participation in activities at home and in the community.    Baseline IE: 49    Time 12    Period Weeks    Status New    Target Date 06/16/21      PT LONG TERM GOAL #4   Title Patient will demonstrate circumferential and sequential contraction of >4/5 MMT, > 6 sec hold x10 and 5 consecutive quick flicks with </= 10 min rest between testing bouts, and relaxation of the PFM coordinated with breath for improved management of intra-abdominal pressure and normal bowel and bladder function without the presence of pain nor incontinence in order to improve participation at home and in the community.    Baseline IE: assessed at next visit    Time 12    Period Weeks    Status New    Target Date 06/16/21      PT LONG TERM GOAL #5   Title Patient will demonstrate coordinated lengthening and relaxation of PFM with diaphragmatic inhalation in order to decrease spasm and allow for unrestricted elimination of urine/feces for improved overall QOL.    Baseline IE: assessed at next visit    Time 12    Period Weeks    Status New    Target Date 06/16/21                   Plan - 04/07/21 1551     Clinical Impression Statement Patient presents to clinic with excellent motivation to participate in therapy. Patient demonstrates deficits in pelvic organ support, pain, PFM coordination, PFM strength . Patient able to achieve good TrA activation for phase 1 and 2 of [facility] abdominal rehab interventions during today's session and responded positively to manual  and educational interventions. Patient will benefit from continued skilled therapeutic intervention to address remaining deficits in pelvic organ support, pain, PFM coordination, PFM strength  in order to increase function and improve overall QOL.    Personal Factors and Comorbidities Behavior Pattern;Comorbidity 3+;Fitness;Past/Current Experience;Time since onset of injury/illness/exacerbation    Comorbidities headache, history of ovarian cyst, anxiety and depression,    Examination-Activity Limitations Locomotion Level;Transfers;Reach Overhead;Squat;Sleep;Continence;Stairs;Stand;Lift;Bend    Examination-Participation Restrictions Cleaning;Occupation;Meal Prep;Community Activity;Laundry;Yard Work;Interpersonal Relationship    Stability/Clinical Decision Making Evolving/Moderate complexity    Rehab Potential Fair    PT Frequency 1x / week    PT Duration 12 weeks    PT Treatment/Interventions ADLs/Self Care Home Management;Cryotherapy;Electrical Stimulation;Moist Heat;Therapeutic exercise;Neuromuscular re-education;Patient/family education;Orthotic Fit/Training;Manual techniques;Scar mobilization;Compression bandaging;Dry needling;Taping;Spinal Manipulations;Joint Manipulations    Consulted and Agree with Plan of Care Patient             Patient will benefit from skilled therapeutic intervention in order to improve the following deficits and impairments:  Decreased coordination, Decreased endurance,  Decreased activity tolerance, Pain, Improper body mechanics, Postural dysfunction, Decreased strength  Visit Diagnosis: Muscle weakness (generalized)  Other lack of coordination  Abnormal posture     Problem List There are no problems to display for this patient.   Sheria Lang PT, DPT [phone]  04/07/2021, 5:11 PM  [facility] [address] [place], [place], [address] Phone: [phone]   Fax:  [phone]  Name: Rolene Andrades MRN: [id number] Date of Birth: 04/18/1986

## 2021-04-14 ENCOUNTER — Encounter: Payer: Self-pay | Admitting: Physical Therapy

## 2021-04-20 ENCOUNTER — Encounter: Payer: Self-pay | Admitting: Physical Therapy

## 2021-05-05 ENCOUNTER — Encounter: Payer: Self-pay | Admitting: Physical Therapy

## 2021-05-12 ENCOUNTER — Encounter: Payer: Self-pay | Admitting: Physical Therapy

## 2021-05-19 ENCOUNTER — Ambulatory Visit (INDEPENDENT_AMBULATORY_CARE_PROVIDER_SITE_OTHER): Payer: 59 | Admitting: Gastroenterology

## 2021-05-19 ENCOUNTER — Encounter: Payer: Self-pay | Admitting: Physical Therapy

## 2021-05-19 ENCOUNTER — Other Ambulatory Visit: Payer: Self-pay

## 2021-05-19 VITALS — BP 123/85 | HR 85 | Temp 98.0°F | Ht 67.0 in | Wt 152.2 lb

## 2021-05-19 DIAGNOSIS — K5900 Constipation, unspecified: Secondary | ICD-10-CM

## 2021-05-19 DIAGNOSIS — R1013 Epigastric pain: Secondary | ICD-10-CM | POA: Diagnosis not present

## 2021-05-19 MED ORDER — OMEPRAZOLE 40 MG PO CPDR: 40.0000 mg | DELAYED_RELEASE_CAPSULE | Freq: Every day | ORAL | 0 refills | Status: DC

## 2021-05-19 NOTE — Progress Notes (Signed)
Julia Chandler [address]  [address]  [place], [place] [address]  Main: [phone]  Fax: [phone]   Gastroenterology Consultation  Referring Provider:     Annye Asa* Primary Care Physician:  Serafina Royals Reason for Consultation:     Abdominal pain        HPI:    Chief Complaint  Patient presents with   New Patient (Initial Visit)    Epigastric abd pain, describes this pain as cramping, also lower abd/pelvic pain short time after eating sharp/stabbing, Pt also reports constipation average of 2 BM a week... Denies N/V/D--- denies family Hx of GI conditions/cancer ... pt has tried OTC stool softener and probiotic with minimal relief      Julia Chandler is a 35 y.o. y/o female referred for consultation & management  by Serafina Royals.  Patient reports 35-month history of epigastric pain, intermittent, dull, 5 or 10, nonradiating.  No nausea or vomiting.  Reports weight loss with this.  Worsens with meals and occurs within 30 minutes after eating a meal.  Improves when she does not need.  Reports decreased p.o. intake due to this.  Reports NSAID use including Advil intermittently.  No melena.  No altered bowel habits.  States she has started to not eat 3 hours before bedtime and using Alka-Seltzer as needed, and this has helped her symptoms as well, but symptoms do continue daily.  Has stopped taking Advil since reading about it online and its risks of causing GI ulcers.  Is using Tylenol as needed instead.  No prior EGD or colonoscopy.  No dysphagia.  Reports chronic history of constipation and at baseline does not have a bowel movement for 3 days.  Uses stool softeners as needed.  No blood in stool.  No prior colonoscopy.  Flowsheets do show 14 pound weight loss in 14 months  No family history of colon cancer  Certified nurse midwife note from May 2022 reviewed and they referred patient to [place] and physical therapy for abdominal pain.  Patient states  physical therapy helped with her vaginal itching symptoms, but not with her abdominal pain.  Physical therapist note from May 2022 reviewed and note that patient has history of pelvic pain, abdominal pain, incomplete bladder emptying and pelvic organ prolapse  Past Medical History:  Diagnosis Date   Headache(784.0)    Ovarian cyst    Preterm labor    Vaginal Pap smear, abnormal    f/u ok    Past Surgical History:  Procedure Laterality Date   WISDOM TOOTH EXTRACTION      Prior to Admission medications   Medication Sig Start Date End Date Taking? Authorizing Provider  Bacillus Coagulans-Inulin (PROBIOTIC FORMULA PO) Take by mouth.   Yes [provider]  Docusate Calcium (STOOL SOFTENER PO) Take by mouth.   Yes [provider]  omeprazole (PRILOSEC) 40 MG capsule Take 1 capsule (40 mg total) by mouth daily. 05/19/21  Yes Pasty Spillers, MD    Family History  Problem Relation Age of Onset   Cancer Mother    Miscarriages / Stillbirths Mother    Hypertension Father    Anxiety disorder Sister    Thyroid disease Maternal Grandmother    Cancer Maternal Grandfather    Heart disease Paternal Grandmother    Hypertension Paternal Grandmother    Heart disease Paternal Grandfather    Hypertension Paternal Grandfather      Social History   Tobacco Use   Smoking status: Never  Smokeless tobacco: Never  Vaping Use   Vaping Use: Never used  Substance Use Topics   Alcohol use: Yes    Comment: occasionally   Drug use: No    Allergies as of 05/19/2021 - Review Complete 05/19/2021  Allergen Reaction Noted   Vicodin [hydrocodone-acetaminophen] Rash 02/04/2014   Percocet [oxycodone-acetaminophen] Rash 02/08/2014    Review of Systems:    All systems reviewed and negative except where noted in HPI.   Physical Exam:  Constitutional: General:   Alert,  Well-developed, well-nourished, pleasant and cooperative in NAD BP 123/85   Pulse 85   Temp 98 F (36.7 C)  (Oral)   Ht 5\' 7"  (1.702 m)   Wt 152 lb 3.2 oz (69 kg)   BMI 23.84 kg/m   Eyes:  Sclera clear, no icterus.   Conjunctiva pink. PERRLA  Ears:  No scars, lesions or masses, Normal auditory acuity. Nose:  No deformity, discharge, or lesions. Mouth:  No deformity or lesions, oropharynx pink & moist.  Neck:  Supple; no masses or thyromegaly.  Respiratory: Normal respiratory effort, Normal percussion  Gastrointestinal:  No bruits.  Soft, non-tender and non-distended without masses, hepatosplenomegaly or hernias noted.  No guarding or rebound tenderness.     Cardiac: No clubbing or edema.  No cyanosis. Normal posterior tibial pedal pulses noted.  Lymphatic:  No significant cervical or axillary adenopathy.  Psych:  Alert and cooperative. Normal mood and affect.  Musculoskeletal:  Normal gait. Head normocephalic, atraumatic. Symmetrical without gross deformities. 5/5 Upper and Lower extremity strength bilaterally.  Skin: Warm. Intact without significant lesions or rashes. No jaundice.  Neurologic:  Face symmetrical, tongue midline, Normal sensation to touch;  grossly normal neurologically.  Psych:  Alert and oriented x3, Alert and cooperative. Normal mood and affect.   Labs: CBC    Component Value Date/Time   WBC 7.8 12/14/2016 1204   WBC 15.8 (H) 11/04/2016 0638   RBC 4.29 12/14/2016 1204   RBC 3.62 (L) 11/04/2016 0638   HGB 11.3 12/14/2016 1204   HCT 35.6 12/14/2016 1204   PLT 364 12/14/2016 1204   MCV 83 12/14/2016 1204   MCH 26.3 (L) 12/14/2016 1204   MCH 26.3 11/04/2016 0638   MCHC 31.7 12/14/2016 1204   MCHC 32.9 11/04/2016 0638   RDW 17.7 (H) 12/14/2016 1204   LYMPHSABS 1.7 03/18/2016 1055   EOSABS 0.0 03/18/2016 1055   BASOSABS 0.0 03/18/2016 1055   CMP     Component Value Date/Time   NA 140 04/13/2017 1038   K 4.6 04/13/2017 1038   CL 103 04/13/2017 1038   CO2 22 04/13/2017 1038   GLUCOSE 79 04/13/2017 1038   GLUCOSE 81 02/08/2014 0635   BUN 11  04/13/2017 1038   CREATININE 0.62 04/13/2017 1038   CALCIUM 9.6 04/13/2017 1038   PROT 7.0 04/13/2017 1038   ALBUMIN 4.3 04/13/2017 1038   AST 13 04/13/2017 1038   ALT 12 04/13/2017 1038   ALKPHOS 117 04/13/2017 1038   BILITOT <0.2 04/13/2017 1038   GFRNONAA 121 04/13/2017 1038   GFRAA 139 04/13/2017 1038    Imaging Studies:   Assessment and Plan:   Nicey Krah is a 35 y.o. y/o female has been referred for epigastric pain  Given patient's previous NSAID use, she may have developed gastritis or small gastric ulcers.  She is not having any clinical symptoms of bleeding from them.  No indication for urgent endoscopy at this time  We will begin with labs to see if she  has any anemia.  If anemia present, would recommend proceeding with upper endoscopy.  She denies any menorrhagia.  We will also obtain H. pylori breath test today  Start PPI once daily for treatment of possible gastritis or gastric ulcers that may have developed from her NSAID use  Avoid NSAID use such as Ibuprofen, Aleeve, advil, motrin, BC and Goodie powder, Naproxen, Meloxicam and others.   (Risks of PPI use were discussed with patient including bone loss, C. Diff diarrhea, pneumonia, infections, CKD, electrolyte abnormalities.  Pt. Verbalizes understanding and chooses to continue the medication.)  Check liver enzymes as well given epigastric location of pain  She also has chronic constipation that can also cause pain. High-fiber diet Patient advised to start using either fiber supplement, or MiraLAX daily MiraLAX daily with goal of 1-2 soft bowel movements daily.  If not at goal, patient instructed to increase dose to twice daily.  If loose stools with the medication, patient asked to decrease the medication to every other day, or half dose daily.  Patient verbalized understanding  If above symptoms do not improve, consider EGD and colonoscopy   Dr Julia Chandler  Speech recognition software was used  to dictate the above note.

## 2021-05-20 LAB — CBC
Hematocrit: 39.2 % (ref 34.0–46.6)
Hemoglobin: 13.1 g/dL (ref 11.1–15.9)
MCH: 29.1 pg (ref 26.6–33.0)
MCHC: 33.4 g/dL (ref 31.5–35.7)
MCV: 87 fL (ref 79–97)
Platelets: 340 10*3/uL (ref 150–450)
RBC: 4.5 x10E6/uL (ref 3.77–5.28)
RDW: 14.2 % (ref 11.7–15.4)
WBC: 8 10*3/uL (ref 3.4–10.8)

## 2021-05-20 LAB — HEPATIC FUNCTION PANEL
ALT: 13 IU/L (ref 0–32)
AST: 14 IU/L (ref 0–40)
Albumin: 4.3 g/dL (ref 3.8–4.8)
Alkaline Phosphatase: 77 IU/L (ref 44–121)
Bilirubin Total: 0.2 mg/dL (ref 0.0–1.2)
Bilirubin, Direct: <0.1 mg/dL (ref 0.00–0.40)
Total Protein: 7.2 g/dL (ref 6.0–8.5)

## 2021-05-20 LAB — H. PYLORI BREATH TEST: H pylori Breath Test: NEGATIVE

## 2021-05-25 ENCOUNTER — Ambulatory Visit: Payer: 59 | Admitting: Physical Therapy

## 2021-06-01 ENCOUNTER — Ambulatory Visit: Payer: 59 | Attending: Certified Nurse Midwife | Admitting: Physical Therapy

## 2021-06-02 ENCOUNTER — Other Ambulatory Visit: Payer: Self-pay | Admitting: Gastroenterology

## 2021-07-27 ENCOUNTER — Encounter: Payer: Self-pay | Admitting: Gastroenterology

## 2021-07-27 ENCOUNTER — Ambulatory Visit: Payer: 59 | Admitting: Gastroenterology

## 2022-03-29 IMAGING — US US PELVIS COMPLETE WITH TRANSVAGINAL
1 series · 14 of 25 positions shown · non-contrast
Comparison: None

CLINICAL DATA: Pelvic pain for 8 months

EXAM:
TRANSABDOMINAL AND TRANSVAGINAL ULTRASOUND OF PELVIS
TECHNIQUE: Both transabdominal and transvaginal ultrasound examinations of the
pelvis were performed. Transabdominal technique was performed for
global imaging of the pelvis including uterus, ovaries, adnexal
regions, and pelvic cul-de-sac. It was necessary to proceed with
endovaginal exam following the transabdominal exam to visualize the
uterus endometrium ovaries.

[Series 1: us pelvic complete with transvaginal · 14 of 118 slices shown]
[im 1/118]
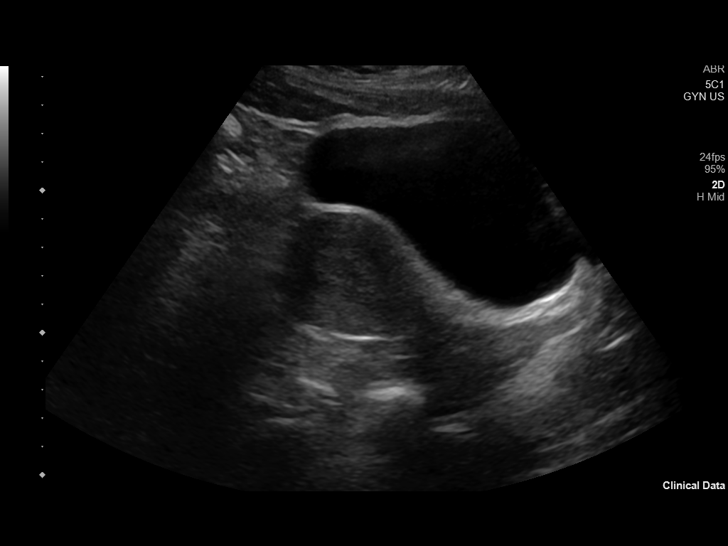
[im 10/118]
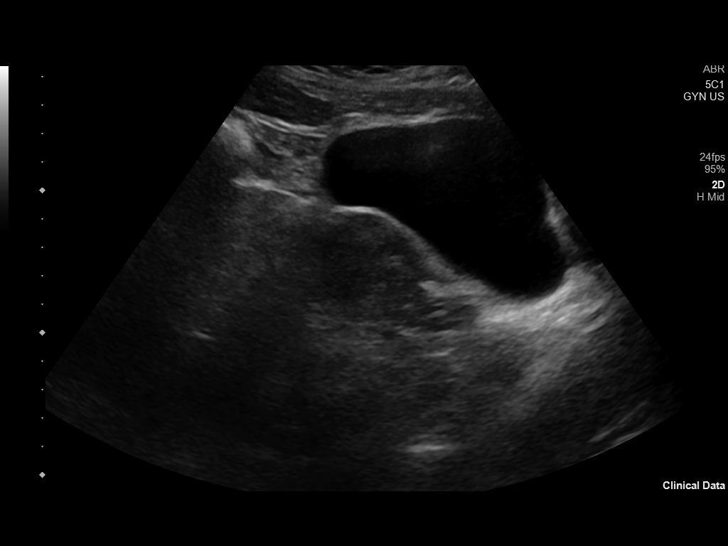
[im 20/118]
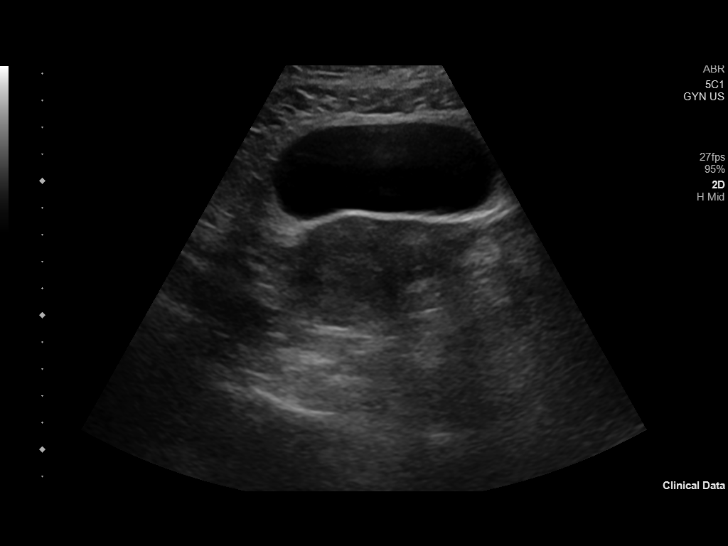
[im 30/118]
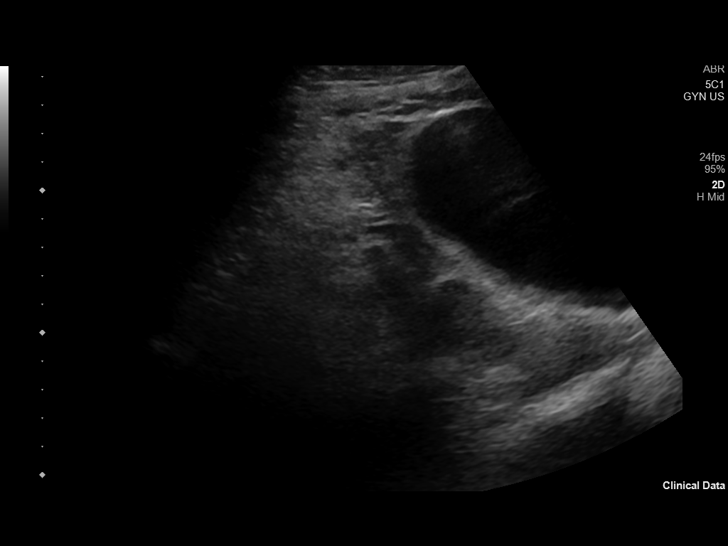
[im 40/118]
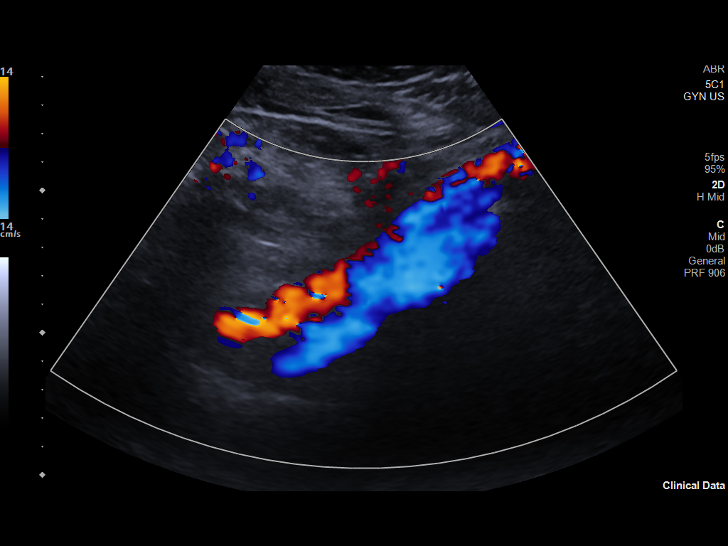
[im 44/118]
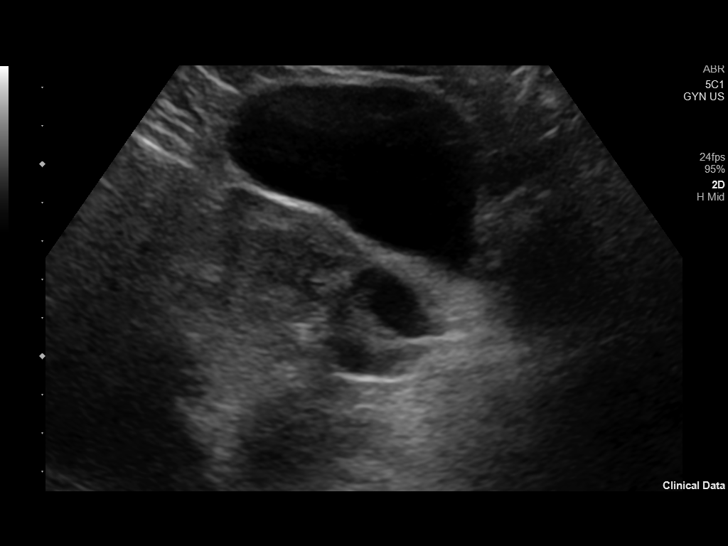
[im 54/118]
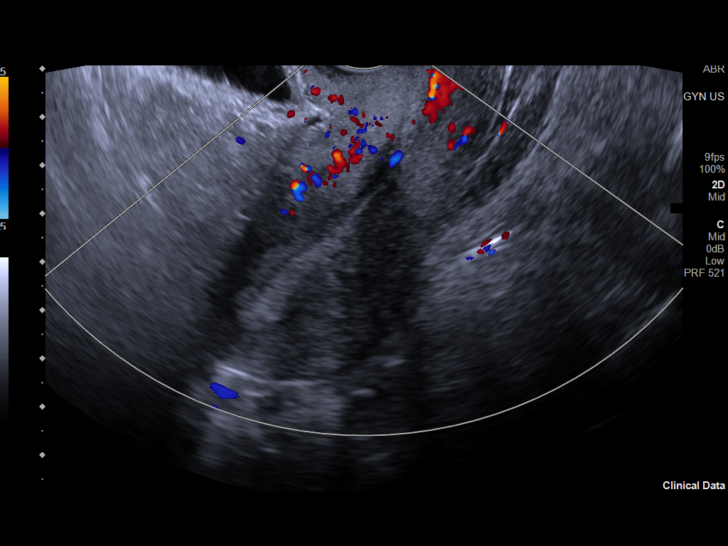
[im 64/118]
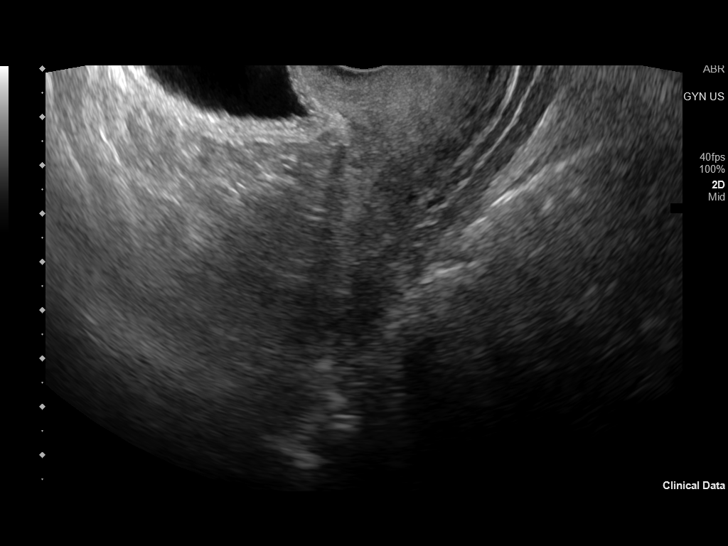
[im 74/118]
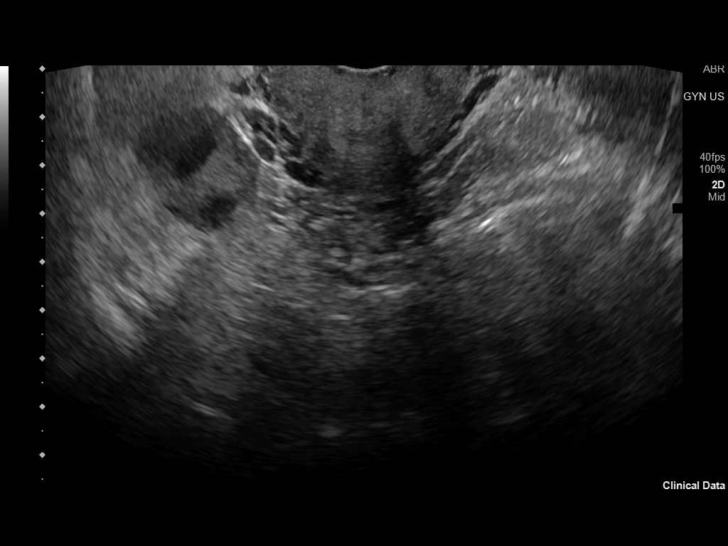
[im 79/118]
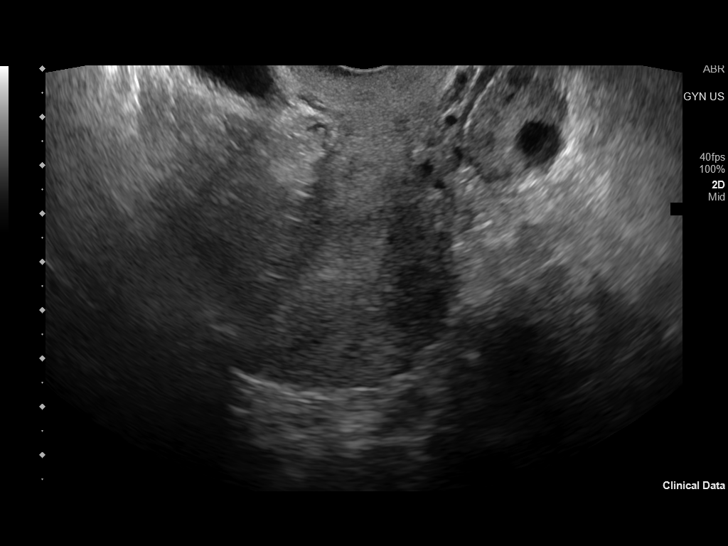
[im 88/118]
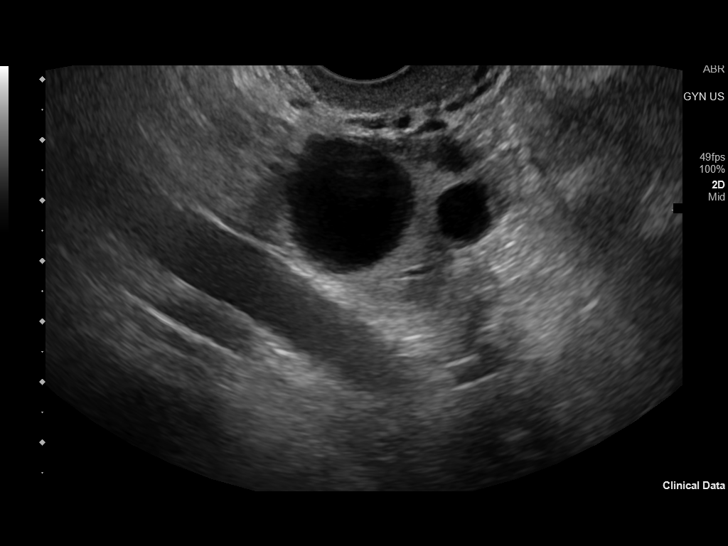
[im 98/118]
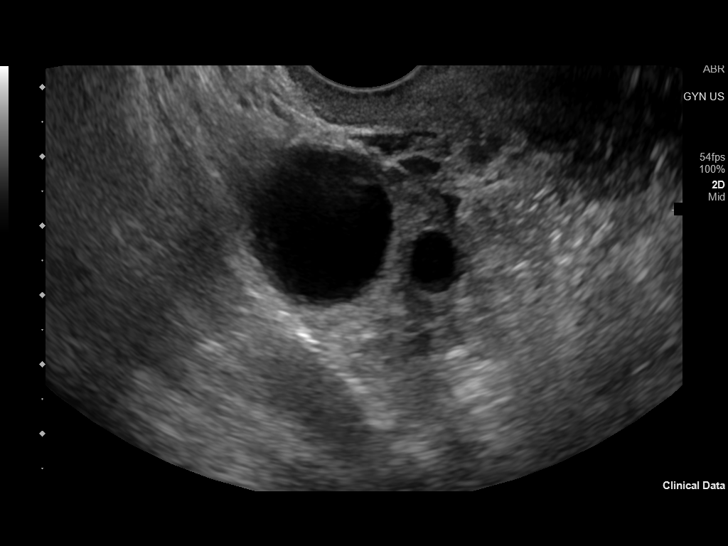
[im 108/118]
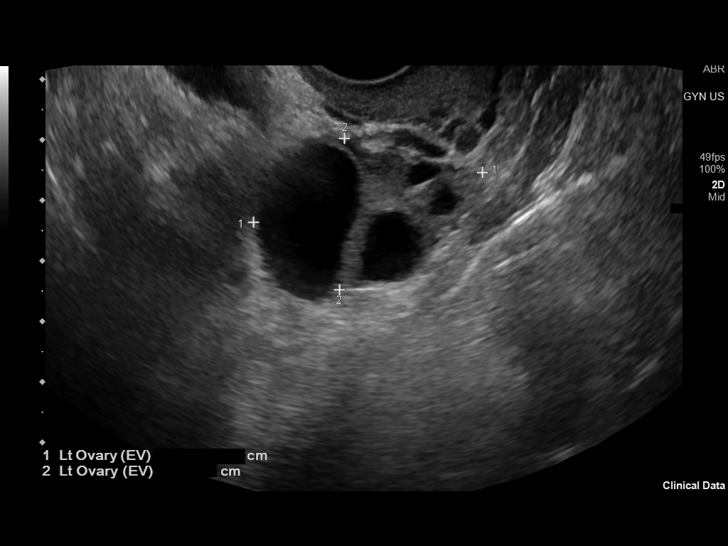
[im 118/118]
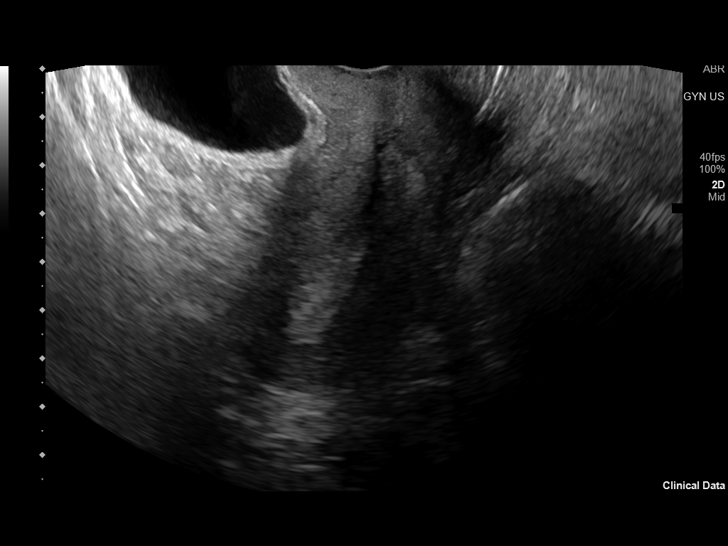

[14 of 25 positions shown; findings below may reference images not displayed]

FINDINGS: Uterus

Measurements: 9 x 4.1 x 5.5 cm = volume: 103.7 mL. No fibroids or
other mass visualized.

Endometrium

Thickness: 11 mm.  No focal abnormality visualized.

Right ovary

Measurements: 3.5 x 2.4 x 3.1 cm = volume: 13.5 mL. Normal
appearance/no adnexal mass.

Left ovary

Measurements: 3.9 x 2.5 x 2.9 cm = volume: 14.5 mL. Normal
appearance/no adnexal mass.

Other findings

No abnormal free fluid.
IMPRESSION: Negative pelvic ultrasound
